# Patient Record
Sex: Female | Born: 1980
Health system: Southern US, Community
[De-identification: ages and names within clinical notes are randomized; demographics above are authoritative.]

## PROBLEM LIST (undated history)

## (undated) DIAGNOSIS — R569 Unspecified convulsions: Secondary | ICD-10-CM

## (undated) DIAGNOSIS — I10 Essential (primary) hypertension: Secondary | ICD-10-CM

## (undated) DIAGNOSIS — K219 Gastro-esophageal reflux disease without esophagitis: Secondary | ICD-10-CM

## (undated) DIAGNOSIS — F988 Other specified behavioral and emotional disorders with onset usually occurring in childhood and adolescence: Secondary | ICD-10-CM

## (undated) HISTORY — DX: Essential (primary) hypertension: I10

## (undated) HISTORY — DX: Unspecified convulsions: R56.9

## (undated) HISTORY — DX: Gastro-esophageal reflux disease without esophagitis: K21.9

---
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
## 1999-04-24 ENCOUNTER — Other Ambulatory Visit: Admission: RE | Admit: 1999-04-24 | Discharge: 1999-04-24 | Payer: Self-pay | Admitting: Obstetrics and Gynecology

## 1999-05-30 ENCOUNTER — Other Ambulatory Visit: Admission: RE | Admit: 1999-05-30 | Discharge: 1999-05-30 | Payer: Self-pay | Admitting: *Deleted

## 1999-05-30 ENCOUNTER — Encounter (INDEPENDENT_AMBULATORY_CARE_PROVIDER_SITE_OTHER): Payer: Self-pay | Admitting: Specialist

## 1999-11-20 ENCOUNTER — Other Ambulatory Visit: Admission: RE | Admit: 1999-11-20 | Discharge: 1999-11-20 | Payer: Self-pay | Admitting: *Deleted

## 1999-12-24 ENCOUNTER — Encounter (INDEPENDENT_AMBULATORY_CARE_PROVIDER_SITE_OTHER): Payer: Self-pay | Admitting: Specialist

## 1999-12-24 ENCOUNTER — Other Ambulatory Visit: Admission: RE | Admit: 1999-12-24 | Discharge: 1999-12-24 | Payer: Self-pay | Admitting: *Deleted

## 2000-05-08 ENCOUNTER — Other Ambulatory Visit: Admission: RE | Admit: 2000-05-08 | Discharge: 2000-05-08 | Payer: Self-pay | Admitting: *Deleted

## 2000-08-13 ENCOUNTER — Other Ambulatory Visit: Admission: RE | Admit: 2000-08-13 | Discharge: 2000-08-13 | Payer: Self-pay | Admitting: *Deleted

## 2000-08-13 ENCOUNTER — Encounter (INDEPENDENT_AMBULATORY_CARE_PROVIDER_SITE_OTHER): Payer: Self-pay | Admitting: Specialist

## 2001-02-12 ENCOUNTER — Other Ambulatory Visit: Admission: RE | Admit: 2001-02-12 | Discharge: 2001-02-12 | Payer: Self-pay | Admitting: *Deleted

## 2001-09-17 ENCOUNTER — Other Ambulatory Visit: Admission: RE | Admit: 2001-09-17 | Discharge: 2001-09-17 | Payer: Self-pay | Admitting: *Deleted

## 2003-04-28 ENCOUNTER — Other Ambulatory Visit: Admission: RE | Admit: 2003-04-28 | Discharge: 2003-04-28 | Payer: Self-pay | Admitting: *Deleted

## 2004-05-20 ENCOUNTER — Other Ambulatory Visit: Admission: RE | Admit: 2004-05-20 | Discharge: 2004-05-20 | Payer: Self-pay | Admitting: Obstetrics and Gynecology

## 2008-09-08 ENCOUNTER — Inpatient Hospital Stay (HOSPITAL_COMMUNITY): Admission: AD | Admit: 2008-09-08 | Discharge: 2008-09-11 | Payer: Self-pay | Admitting: Obstetrics and Gynecology

## 2010-06-09 LAB — LACTATE DEHYDROGENASE: LDH: 252 U/L — ABNORMAL HIGH (ref 94–250)

## 2010-06-09 LAB — CBC
HCT: 34.4 % — ABNORMAL LOW (ref 36.0–46.0)
HCT: 34.7 % — ABNORMAL LOW (ref 36.0–46.0)
HCT: 37.4 % (ref 36.0–46.0)
HCT: 37.9 % (ref 36.0–46.0)
Hemoglobin: 12 g/dL (ref 12.0–15.0)
Hemoglobin: 12.2 g/dL (ref 12.0–15.0)
Hemoglobin: 13.3 g/dL (ref 12.0–15.0)
MCHC: 35 g/dL (ref 30.0–36.0)
MCHC: 35.1 g/dL (ref 30.0–36.0)
MCHC: 35.2 g/dL (ref 30.0–36.0)
MCHC: 35.3 g/dL (ref 30.0–36.0)
MCV: 93.2 fL (ref 78.0–100.0)
MCV: 93.9 fL (ref 78.0–100.0)
MCV: 94.9 fL (ref 78.0–100.0)
Platelets: 114 10*3/uL — ABNORMAL LOW (ref 150–400)
Platelets: 127 10*3/uL — ABNORMAL LOW (ref 150–400)
Platelets: 131 10*3/uL — ABNORMAL LOW (ref 150–400)
RBC: 3.65 MIL/uL — ABNORMAL LOW (ref 3.87–5.11)
RBC: 3.66 MIL/uL — ABNORMAL LOW (ref 3.87–5.11)
RBC: 4.01 MIL/uL (ref 3.87–5.11)
RDW: 13.5 % (ref 11.5–15.5)
RDW: 13.6 % (ref 11.5–15.5)
RDW: 13.6 % (ref 11.5–15.5)
WBC: 13.9 10*3/uL — ABNORMAL HIGH (ref 4.0–10.5)
WBC: 14.1 10*3/uL — ABNORMAL HIGH (ref 4.0–10.5)

## 2010-06-09 LAB — CCBB MATERNAL DONOR DRAW

## 2010-06-09 LAB — COMPREHENSIVE METABOLIC PANEL
ALT: 16 U/L (ref 0–35)
AST: 29 U/L (ref 0–37)
Albumin: 2.4 g/dL — ABNORMAL LOW (ref 3.5–5.2)
Alkaline Phosphatase: 132 U/L — ABNORMAL HIGH (ref 39–117)
GFR calc Af Amer: >60 mL/min (ref >60)
Glucose, Bld: 84 mg/dL (ref 70–99)
Potassium: 3.7 mEq/L (ref 3.5–5.1)
Sodium: 138 mEq/L (ref 135–145)
Total Protein: 5.1 g/dL — ABNORMAL LOW (ref 6.0–8.3)

## 2010-07-16 NOTE — Op Note (Signed)
NAMEMARYLIN, LATHON             ACCOUNT NO.:  [id number]   MEDICAL RECORD NO.:  [id number]          PATIENT TYPE:  INP   LOCATION:  9120                          FACILITY:  [facility]   PHYSICIAN:  Duke Salvia. Marcelle Overlie, M.D.DATE OF BIRTH:  02-Jul-1980   DATE OF PROCEDURE:  09/09/2008  DATE OF DISCHARGE:                               OPERATIVE REPORT   DELIVERY NOTE:  The patient was complete and pushing, variable  decelerations were noted.  Decision was made to proceed with vacuum-  assisted delivery due to bradycardia.  At that point, she was +3 and  with the scalp visible, straight OA with good epidural anesthesia, and  her bladder had recently been drained.  Kiwi was applied, traction  efforts coordinated with maternal pushing, which let down the pressure  in between x3 traction, efforts to effect delivery of a female, Apgars 7  and 9, pH 7.24, loose nuchal cord x2 was noted.  The NICU team was  present after the nurse's request secondary to bradycardia.  Placenta  was delivered spontaneously intact, cord blood sent for donation.  A  second-degree perineal laceration and bilateral superficial periurethral  lacerations were repaired with 3-0 Vicryl Rapide.  Mother and baby doing  well at that point.      Richard M. Marcelle Overlie, M.D.  Electronically Signed     [facility]/MEDQ  D:  09/09/2008  T:  09/10/2008  Job:  [id number]

## 2010-07-24 ENCOUNTER — Inpatient Hospital Stay (HOSPITAL_COMMUNITY)
Admission: AD | Admit: 2010-07-24 | Discharge: 2010-07-27 | DRG: 774 | Disposition: A | Discharge status: Home / Self Care | Payer: 59 | Source: Ambulatory Visit | Attending: Obstetrics and Gynecology | Admitting: Obstetrics and Gynecology

## 2010-07-24 DIAGNOSIS — O1002 Pre-existing essential hypertension complicating childbirth: Principal | ICD-10-CM | POA: Diagnosis present

## 2010-07-24 LAB — URINALYSIS, ROUTINE W REFLEX MICROSCOPIC
Bilirubin Urine: NEGATIVE
Glucose, UA: NEGATIVE mg/dL
Ketones, ur: NEGATIVE mg/dL
Protein, ur: NEGATIVE mg/dL
pH: 7.5 (ref 5.0–8.0)

## 2010-07-24 LAB — CBC
HCT: 37.5 % (ref 36.0–46.0)
Hemoglobin: 12.4 g/dL (ref 12.0–15.0)
MCH: 29.3 pg (ref 26.0–34.0)
MCHC: 33.1 g/dL (ref 30.0–36.0)
MCV: 88.7 fL (ref 78.0–100.0)
RBC: 4.23 MIL/uL (ref 3.87–5.11)

## 2010-07-24 LAB — COMPREHENSIVE METABOLIC PANEL
AST: 17 U/L (ref 0–37)
Albumin: 2.3 g/dL — ABNORMAL LOW (ref 3.5–5.2)
Alkaline Phosphatase: 209 U/L — ABNORMAL HIGH (ref 39–117)
BUN: 8 mg/dL (ref 6–23)
Chloride: 104 mEq/L (ref 96–112)
Creatinine, Ser: 0.54 mg/dL (ref 0.4–1.2)
GFR calc Af Amer: >60 mL/min (ref >60)
Potassium: 3.7 mEq/L (ref 3.5–5.1)
Total Bilirubin: 0.4 mg/dL (ref 0.3–1.2)
Total Protein: 5.8 g/dL — ABNORMAL LOW (ref 6.0–8.3)

## 2010-07-24 LAB — URINE MICROSCOPIC-ADD ON

## 2010-07-24 LAB — URIC ACID: Uric Acid, Serum: 5.7 mg/dL (ref 2.4–7.0)

## 2010-07-25 ENCOUNTER — Other Ambulatory Visit: Payer: Self-pay | Admitting: Obstetrics and Gynecology

## 2010-07-25 LAB — RPR: RPR Ser Ql: NONREACTIVE

## 2010-07-26 LAB — CBC
HCT: 34.4 % — ABNORMAL LOW (ref 36.0–46.0)
Hemoglobin: 11.3 g/dL — ABNORMAL LOW (ref 12.0–15.0)
MCHC: 32.8 g/dL (ref 30.0–36.0)
RBC: 3.81 MIL/uL — ABNORMAL LOW (ref 3.87–5.11)

## 2013-02-04 ENCOUNTER — Ambulatory Visit (INDEPENDENT_AMBULATORY_CARE_PROVIDER_SITE_OTHER): Payer: 59 | Admitting: Physician Assistant

## 2013-02-04 VITALS — BP 100/80 | HR 100 | Temp 98.8°F | Resp 16 | Ht 65.0 in | Wt 143.0 lb

## 2013-02-04 DIAGNOSIS — R05 Cough: Secondary | ICD-10-CM

## 2013-02-04 DIAGNOSIS — J069 Acute upper respiratory infection, unspecified: Secondary | ICD-10-CM

## 2013-02-04 DIAGNOSIS — F988 Other specified behavioral and emotional disorders with onset usually occurring in childhood and adolescence: Secondary | ICD-10-CM | POA: Insufficient documentation

## 2013-02-04 MED ORDER — HYDROCOD POLST-CHLORPHEN POLST 10-8 MG/5ML PO LQCR: 5.0000 mL | Freq: Two times a day (BID) | ORAL | Status: AC

## 2013-02-04 NOTE — Progress Notes (Signed)
   Subjective:    Patient ID: Megan Lewis, female    DOB: [birth year], 32 y.o.   MRN: [id number]  HPI Pt presents to clinic with 3 day h/o cold symptoms.  1 week ago for a day she had myalgias but they completely resolved. Then 3 days ago she lost her voice and developed a cough with myalgias.  When it started the cough was dry in her throat - like a tickle and she still has that type of cough but now she has green sputum that is worse in the am an dis seems like it has dropped into her chest a little.  Still worse at night when she lays down.  She is a Armed forces operational officer.  No h/o asthma, non-smoking OTC med - mucinex for the last couple of days Sick contacts - none Review of Systems  Constitutional: Positive for chills. Negative for fever.  HENT: Positive for postnasal drip (intermittent), sore throat and voice change. Negative for congestion.   Respiratory: Positive for cough.   Musculoskeletal: Positive for myalgias.  Psychiatric/Behavioral: Positive for sleep disturbance (2nd toc ough).       Objective:   Physical Exam  Vitals reviewed. Constitutional: She is oriented to person, place, and time. She appears well-developed and well-nourished.  HENT:  Head: Normocephalic and atraumatic.  Right Ear: Hearing, tympanic membrane, external ear and ear canal normal.  Left Ear: Hearing, tympanic membrane, external ear and ear canal normal.  Nose: Mucosal edema (red) present.  Mouth/Throat: Uvula is midline, oropharynx is clear and moist and mucous membranes are normal.  Eyes: Conjunctivae are normal.  Neck: Neck supple.  Cardiovascular: Normal rate, regular rhythm and normal heart sounds.   No murmur heard. Pulmonary/Chest: Effort normal and breath sounds normal. She has no wheezes.  Lymphadenopathy:    She has no cervical adenopathy.  Neurological: She is alert and oriented to person, place, and time.  Skin: Skin is warm and dry.  Psychiatric: She has a normal mood and affect. Her  behavior is normal. Judgment and thought content normal.       Assessment & Plan:  Cough - Plan: chlorpheniramine-HYDROcodone (TUSSIONEX PENNKINETIC ER) 10-8 MG/5ML LQCR  Push fluids. Continue mucinex - Call in 3 days if the productive cough has not gotten improved for an abx to cover bronchitis.  Benny Lennert PA-C 02/04/2013 9:34 AM

## 2014-04-19 ENCOUNTER — Emergency Department (HOSPITAL_COMMUNITY)
Admission: EM | Admit: 2014-04-19 | Discharge: 2014-04-20 | Disposition: A | Discharge status: Home / Self Care | Payer: 59 | Attending: Emergency Medicine | Admitting: Emergency Medicine

## 2014-04-19 ENCOUNTER — Encounter (HOSPITAL_COMMUNITY): Payer: Self-pay | Admitting: *Deleted

## 2014-04-19 DIAGNOSIS — Z79899 Other long term (current) drug therapy: Secondary | ICD-10-CM | POA: Insufficient documentation

## 2014-04-19 DIAGNOSIS — R1010 Upper abdominal pain, unspecified: Secondary | ICD-10-CM | POA: Diagnosis not present

## 2014-04-19 DIAGNOSIS — R112 Nausea with vomiting, unspecified: Secondary | ICD-10-CM | POA: Diagnosis not present

## 2014-04-19 DIAGNOSIS — Z3202 Encounter for pregnancy test, result negative: Secondary | ICD-10-CM | POA: Diagnosis not present

## 2014-04-19 DIAGNOSIS — M549 Dorsalgia, unspecified: Secondary | ICD-10-CM | POA: Insufficient documentation

## 2014-04-19 DIAGNOSIS — R109 Unspecified abdominal pain: Secondary | ICD-10-CM | POA: Diagnosis present

## 2014-04-19 DIAGNOSIS — R140 Abdominal distension (gaseous): Secondary | ICD-10-CM | POA: Diagnosis not present

## 2014-04-19 LAB — URINE MICROSCOPIC-ADD ON

## 2014-04-19 LAB — CBC WITH DIFFERENTIAL/PLATELET
BASOS PCT: 0 % (ref 0–1)
Basophils Absolute: 0 10*3/uL (ref 0.0–0.1)
EOS PCT: 2 % (ref 0–5)
Eosinophils Absolute: 0.2 10*3/uL (ref 0.0–0.7)
HEMATOCRIT: 46.2 % — AB (ref 36.0–46.0)
Hemoglobin: 16.2 g/dL — ABNORMAL HIGH (ref 12.0–15.0)
LYMPHS ABS: 2.2 10*3/uL (ref 0.7–4.0)
LYMPHS PCT: 22 % (ref 12–46)
MCH: 32 pg (ref 26.0–34.0)
MCHC: 35.1 g/dL (ref 30.0–36.0)
MCV: 91.3 fL (ref 78.0–100.0)
MONO ABS: 0.6 10*3/uL (ref 0.1–1.0)
Monocytes Relative: 6 % (ref 3–12)
NEUTROS ABS: 7.2 10*3/uL (ref 1.7–7.7)
Neutrophils Relative %: 70 % (ref 43–77)
PLATELETS: 193 10*3/uL (ref 150–400)
RBC: 5.06 MIL/uL (ref 3.87–5.11)
RDW: 12.4 % (ref 11.5–15.5)
WBC: 10.3 10*3/uL (ref 4.0–10.5)

## 2014-04-19 LAB — COMPREHENSIVE METABOLIC PANEL
ALT: 13 U/L (ref 0–35)
ANION GAP: 6 (ref 5–15)
AST: 17 U/L (ref 0–37)
Albumin: 4.1 g/dL (ref 3.5–5.2)
Alkaline Phosphatase: 71 U/L (ref 39–117)
BILIRUBIN TOTAL: 1.1 mg/dL (ref 0.3–1.2)
BUN: 9 mg/dL (ref 6–23)
CO2: 28 mmol/L (ref 19–32)
CREATININE: 0.55 mg/dL (ref 0.50–1.10)
Calcium: 9 mg/dL (ref 8.4–10.5)
Chloride: 103 mmol/L (ref 96–112)
GFR calc Af Amer: >90 mL/min (ref >90)
Glucose, Bld: 96 mg/dL (ref 70–99)
POTASSIUM: 3.4 mmol/L — AB (ref 3.5–5.1)
SODIUM: 137 mmol/L (ref 135–145)
Total Protein: 7.3 g/dL (ref 6.0–8.3)

## 2014-04-19 LAB — URINALYSIS, ROUTINE W REFLEX MICROSCOPIC
Bilirubin Urine: NEGATIVE
Glucose, UA: NEGATIVE mg/dL
Hgb urine dipstick: NEGATIVE
Ketones, ur: 15 mg/dL — AB
NITRITE: NEGATIVE
PH: 5.5 (ref 5.0–8.0)
Protein, ur: NEGATIVE mg/dL
Specific Gravity, Urine: 1.028 (ref 1.005–1.030)
Urobilinogen, UA: 0.2 mg/dL (ref 0.0–1.0)

## 2014-04-19 LAB — LIPASE, BLOOD: Lipase: 28 U/L (ref 11–59)

## 2014-04-19 LAB — POC URINE PREG, ED: Preg Test, Ur: NEGATIVE

## 2014-04-19 MED ORDER — SODIUM CHLORIDE 0.9 % IV SOLN
Freq: Once | INTRAVENOUS | Status: AC
  Administered 2014-04-19: 23:00:00 via INTRAVENOUS

## 2014-04-19 MED ORDER — IOHEXOL 300 MG/ML  SOLN
25.0000 mL | Freq: Once | INTRAMUSCULAR | Status: AC | PRN
  Administered 2014-04-19: 25 mL via ORAL

## 2014-04-19 NOTE — ED Notes (Signed)
POC Preg- Negative ?

## 2014-04-19 NOTE — ED Notes (Signed)
The pt has had abd pain since dec 2015.  Worse for the past 2 days.  lmp  None bc

## 2014-04-19 NOTE — ED Provider Notes (Signed)
CSN: [id number]     Arrival date & time 04/19/14  1856 History   First MD Initiated Contact with Patient 04/19/14 2216     Chief Complaint  Patient presents with  . Abdominal Pain     (Consider location/radiation/quality/duration/timing/severity/associated sxs/prior Treatment) HPI Comments: Patient presents to the emergency department with a 3 month history of intermittent diffuse, generalized abdominal pain, cramping associated with nausea, vomiting.  The pain radiates to her back.  She states it happens every 2-3 days.  She denies any fevers associated with this.  She has a negative OB/GYN history a rare occasional cyst that has been observed.  She has an IUD in place.  She is monogamous in a marriage.  Denies any vaginal discharge, vaginal bleeding, abnormal periods.  She startedtissue.  No in her physician's office who told her that she may be low in vitamin D and this was a recommendation that she supplement.  She has an appointment in several weeks with a GI physician.  She has strong family history for gallbladder disease, but cannot relate her symptoms to food consumption, position, time of the day  Patient is a 34 y.o. female presenting with abdominal pain. The history is provided by the patient.  Abdominal Pain Pain location:  Generalized Pain quality: aching and cramping   Pain radiates to:  Back Pain severity:  Mild Onset quality:  Gradual Timing:  Intermittent Progression:  Waxing and waning Chronicity:  Recurrent Context: not alcohol use, not diet changes, not eating, not laxative use, not recent illness, not recent travel, not retching, not sick contacts and not trauma   Relieved by:  None tried Worsened by:  Nothing tried Ineffective treatments:  None tried Associated symptoms: nausea and vomiting   Associated symptoms: no anorexia, no chest pain, no chills, no constipation, no dysuria, no fatigue, no fever, no shortness of breath, no sore throat, no vaginal bleeding and no  vaginal discharge   Nausea:    Severity:  Mild   Onset quality:  Gradual   Timing:  Intermittent   Progression:  Unchanged Vomiting:    Severity:  Moderate   Timing:  Intermittent   History reviewed. No pertinent past medical history. History reviewed. No pertinent past surgical history. No family history on file. History  Substance Use Topics  . Smoking status: Never Smoker   . Smokeless tobacco: Not on file  . Alcohol Use: Yes   OB History    No data available     Review of Systems  Constitutional: Negative for fever, chills and fatigue.  HENT: Negative for sore throat.   Respiratory: Negative for shortness of breath.   Cardiovascular: Negative for chest pain.  Gastrointestinal: Positive for nausea, vomiting and abdominal pain. Negative for constipation and anorexia.  Genitourinary: Negative for dysuria, frequency, flank pain, vaginal bleeding and vaginal discharge.  Musculoskeletal: Positive for back pain.  Skin: Negative for rash.  All other systems reviewed and are negative.     Allergies  Sulfa antibiotics and Nsaids  Home Medications   Prior to Admission medications   Medication Sig Start Date End Date Taking? Authorizing Provider  acetaminophen (TYLENOL) 500 MG tablet Take 1,000 mg by mouth every 6 (six) hours as needed for moderate pain.   Yes Historical Provider, MD  amphetamine-dextroamphetamine (ADDERALL XR) 10 MG 24 hr capsule Take 30 mg by mouth every morning.    Yes Historical Provider, MD  ondansetron (ZOFRAN) 4 MG tablet Take 1 tablet (4 mg total) by mouth every 6 (  six) hours. 04/20/14   Garald Balding, NP  sucralfate (CARAFATE) 1 G tablet Take 1 tablet (1 g total) by mouth 4 (four) times daily -  with meals and at bedtime. 04/20/14   Garald Balding, NP   BP 110/76 mmHg  Pulse 74  Temp(Src) 98.7 F (37.1 C) (Oral)  Resp 19  SpO2 100% Physical Exam  Constitutional: She is oriented to person, place, and time. She appears well-developed and  well-nourished.  HENT:  Head: Normocephalic.  Eyes: Pupils are equal, round, and reactive to light.  Neck: Normal range of motion.  Cardiovascular: Normal rate and regular rhythm.   Pulmonary/Chest: Effort normal and breath sounds normal.  Abdominal: Soft. Bowel sounds are normal. She exhibits no distension. There is no tenderness.  Musculoskeletal: Normal range of motion.  Neurological: She is alert and oriented to person, place, and time.  Skin: Skin is warm. No rash noted.  Nursing note and vitals reviewed.   ED Course  Procedures (including critical care time) Labs Review Labs Reviewed  CBC WITH DIFFERENTIAL/PLATELET - Abnormal; Notable for the following:    Hemoglobin 16.2 (*)    HCT 46.2 (*)    All other components within normal limits  COMPREHENSIVE METABOLIC PANEL - Abnormal; Notable for the following:    Potassium 3.4 (*)    All other components within normal limits  URINALYSIS, ROUTINE W REFLEX MICROSCOPIC - Abnormal; Notable for the following:    APPearance CLOUDY (*)    Ketones, ur 15 (*)    Leukocytes, UA TRACE (*)    All other components within normal limits  URINE MICROSCOPIC-ADD ON - Abnormal; Notable for the following:    Bacteria, UA FEW (*)    All other components within normal limits  LIPASE, BLOOD  POC URINE PREG, ED    Imaging Review Ct Abdomen Pelvis W Contrast  04/20/2014   CLINICAL DATA:  Severe abdominal pain radiating into back. Nausea and diarrhea. Fourth episodes since December.  EXAM: CT ABDOMEN AND PELVIS WITH CONTRAST  TECHNIQUE: Multidetector CT imaging of the abdomen and pelvis was performed using the standard protocol following bolus administration of intravenous contrast.  CONTRAST:  144mL OMNIPAQUE IOHEXOL 300 MG/ML  SOLN  COMPARISON:  None.  FINDINGS: There are normal appearances of the liver, spleen, pancreas, adrenals and kidneys.  Mesentery and bowel are normal.  The appendix is normal.  An IUD is noted. There are otherwise normal  appearances of the uterus and ovaries.  No inflammatory changes are evident in the abdomen or pelvis. There is no ascites. There is no adenopathy.  There are no significant abnormalities in the lower chest. There are no significant musculoskeletal abnormalities.  IMPRESSION: No significant abnormality   Electronically Signed   By: Andreas Newport M.D.   On: 04/20/2014 01:20     EKG Interpretation None     Discussed CT Scan will start Carafate and encourage FU with GI as scheduled  MDM   Final diagnoses:  Distended abdomen  Pain of upper abdomen        Garald Balding, NP 04/20/14 0230  Varney Biles, MD 04/20/14 7672

## 2014-04-19 NOTE — ED Notes (Signed)
Notified CT pt has finished drinking contrast

## 2014-04-20 ENCOUNTER — Emergency Department (HOSPITAL_COMMUNITY): Payer: 59

## 2014-04-20 ENCOUNTER — Encounter (HOSPITAL_COMMUNITY): Payer: Self-pay | Admitting: Radiology

## 2014-04-20 MED ORDER — ONDANSETRON HCL 4 MG PO TABS: 4.0000 mg | ORAL_TABLET | Freq: Four times a day (QID) | ORAL | Status: DC

## 2014-04-20 MED ORDER — IOHEXOL 300 MG/ML  SOLN
100.0000 mL | Freq: Once | INTRAMUSCULAR | Status: AC | PRN
  Administered 2014-04-20: 100 mL via INTRAVENOUS

## 2014-04-20 MED ORDER — SUCRALFATE 1 G PO TABS
1.0000 g | ORAL_TABLET | Freq: Three times a day (TID) | ORAL | Status: DC
Start: 2014-04-20 — End: 2016-04-25

## 2014-04-20 NOTE — Discharge Instructions (Signed)
Abdominal Pain Many things can cause belly (abdominal) pain. Most times, the belly pain is not dangerous. Many cases of belly pain can be watched and treated at home. HOME CARE   Do not take medicines that help you go poop (laxatives) unless told to by your doctor.  Only take medicine as told by your doctor.  Eat or drink as told by your doctor. Your doctor will tell you if you should be on a special diet. GET HELP IF:  You do not know what is causing your belly pain.  You have belly pain while you are sick to your stomach (nauseous) or have runny poop (diarrhea).  You have pain while you pee or poop.  Your belly pain wakes you up at night.  You have belly pain that gets worse or better when you eat.  You have belly pain that gets worse when you eat fatty foods.  You have a fever. GET HELP RIGHT AWAY IF:   The pain does not go away within 2 hours.  You keep throwing up (vomiting).  The pain changes and is only in the right or left part of the belly.  You have bloody or tarry looking poop. MAKE SURE YOU:   Understand these instructions.  Will watch your condition.  Will get help right away if you are not doing well or get worse. Document Released: 08/06/2007 Document Revised: 02/22/2013 Document Reviewed: 10/27/2012 [facility] Patient Information 2015 [place], [place]. This information is not intended to replace advice given to you by your health care provider. Make sure you discuss any questions you have with your health care provider. Please take the Carafate as directed Follow up with the GI doctors as scheduled on Friday

## 2014-04-20 NOTE — ED Notes (Signed)
Pt transported to radiology.

## 2016-01-25 IMAGING — CT CT ABD-PELV W/ CM
2 of 4 series · 16 of 46 positions shown, 18 images · IV contrast (Omni 300)
Comparison: None.

CLINICAL DATA: Severe abdominal pain radiating into back. Nausea
and diarrhea. Fourth episodes since [REDACTED].

EXAM:
CT ABDOMEN AND PELVIS WITH CONTRAST
TECHNIQUE: Multidetector CT imaging of the abdomen and pelvis was performed
using the standard protocol following bolus administration of
intravenous contrast.
CONTRAST:  100mL OMNIPAQUE IOHEXOL 300 MG/ML  SOLN

[Series 2: abd/ pelvis 5.0 i30f 1 · axial · 0.70mm/px · z∈[-200,+190]mm · 13 of 86 slices shown, 15 images]
[im 4/86  soft-tissue]
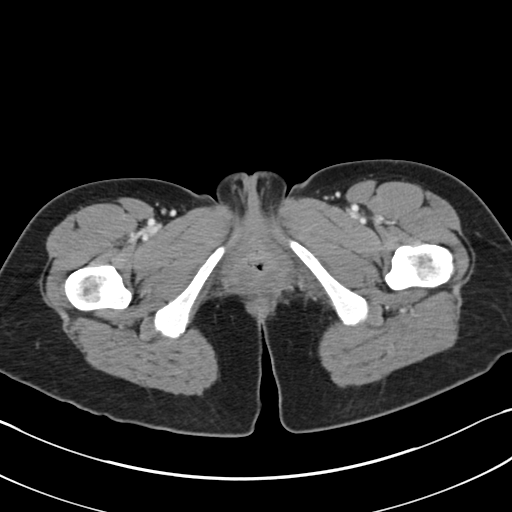
[im 4/86  bone]
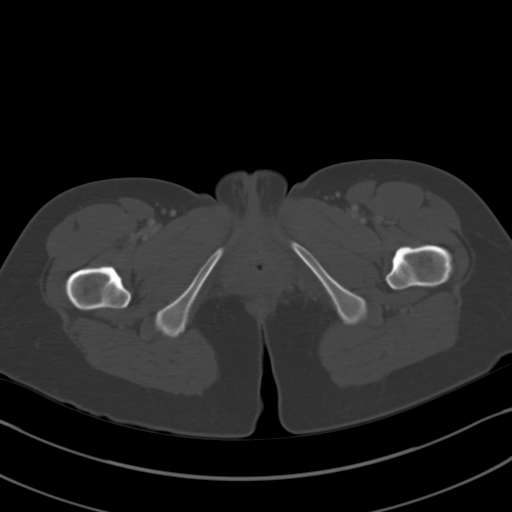
[im 11/86  soft-tissue]
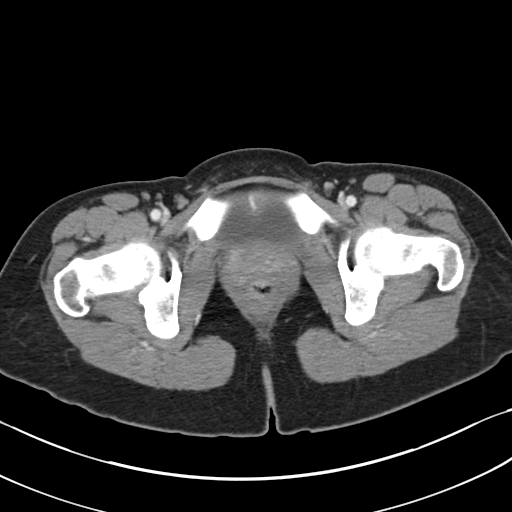
[im 18/86  soft-tissue]
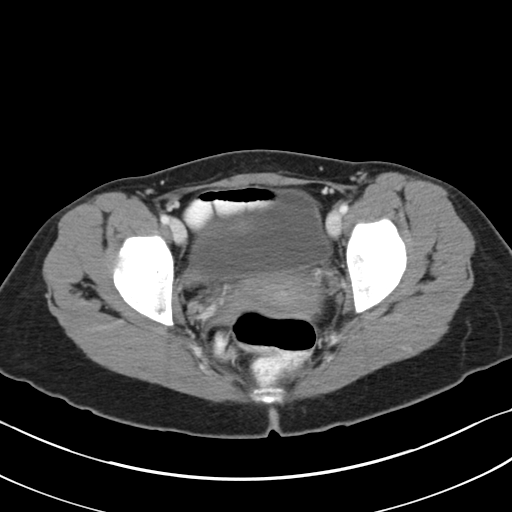
[im 24/86  soft-tissue]
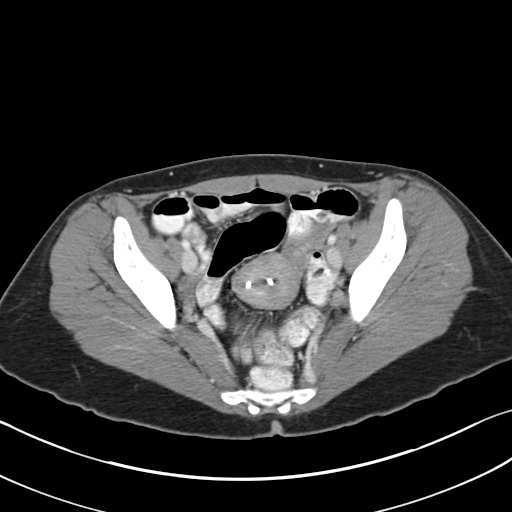
[im 31/86  soft-tissue]
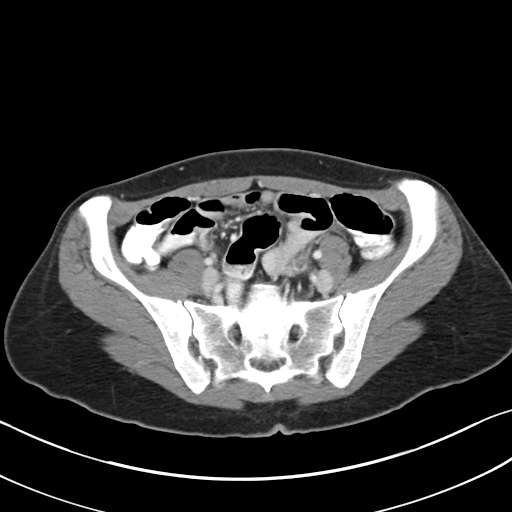
[im 38/86  soft-tissue]
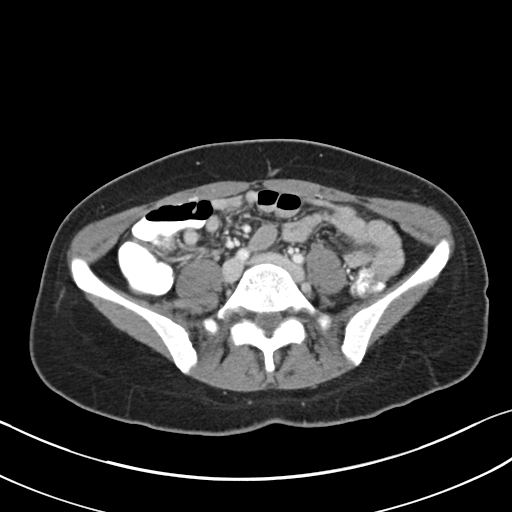
[im 45/86  soft-tissue]
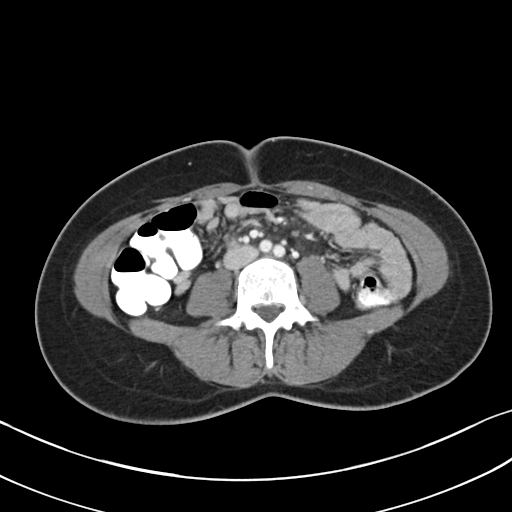
[im 48/86  soft-tissue]
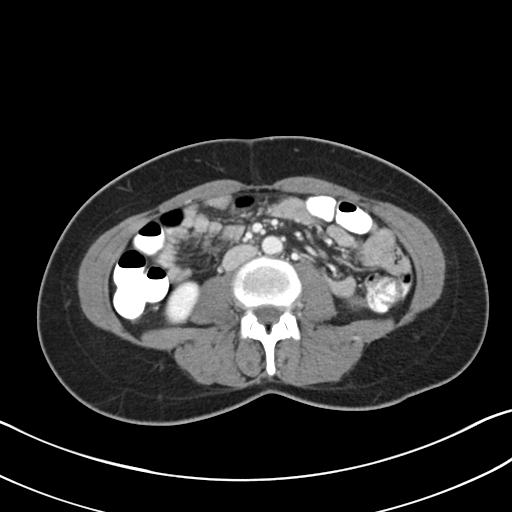
[im 55/86  soft-tissue]
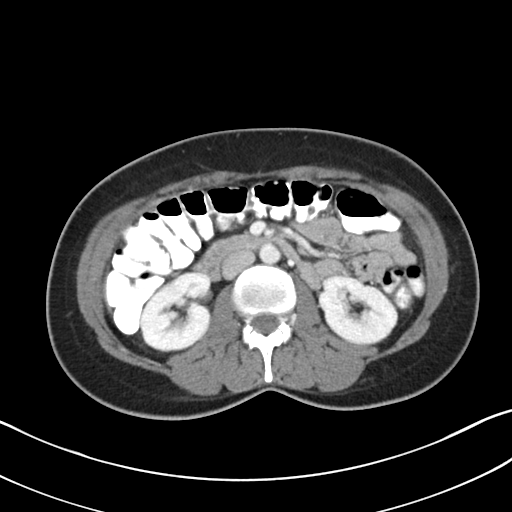
[im 55/86  bone]
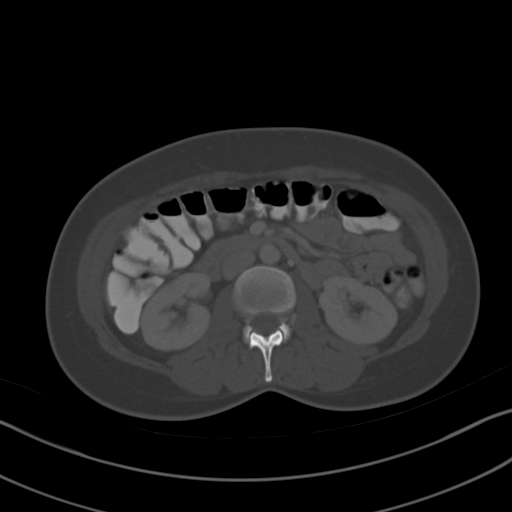
[im 62/86  soft-tissue]
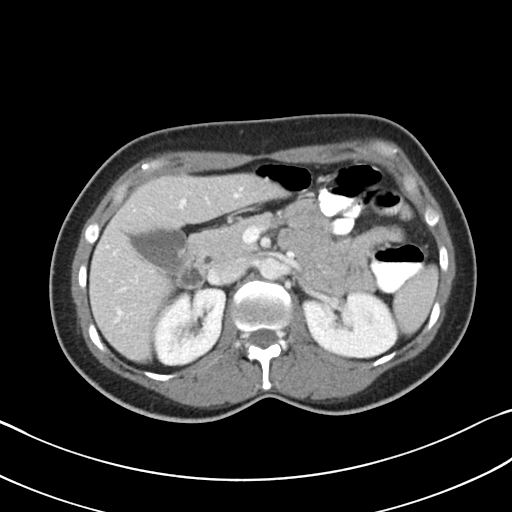
[im 69/86  soft-tissue]
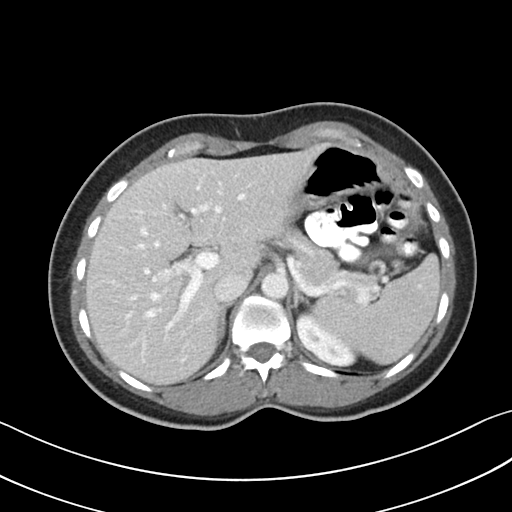
[im 75/86  soft-tissue]
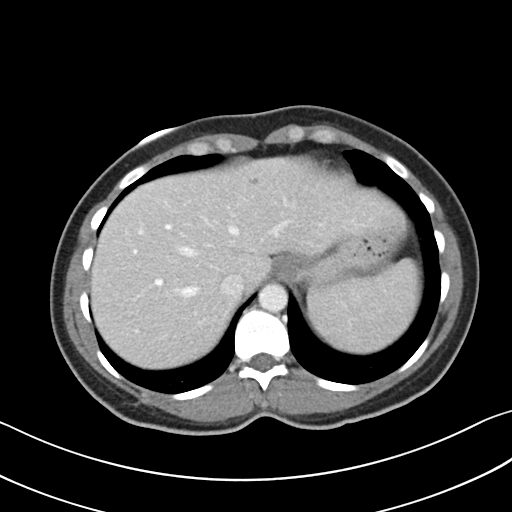
[im 82/86  soft-tissue]
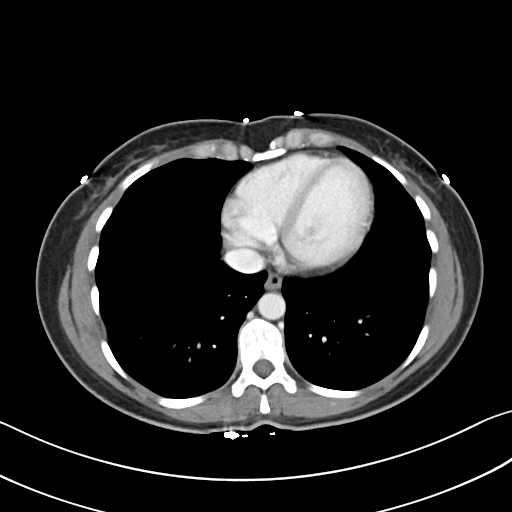

[Series 5: coronals · coronal · 0.70mm/px · 3 of 103 slices shown]
[im 35/103  soft-tissue]
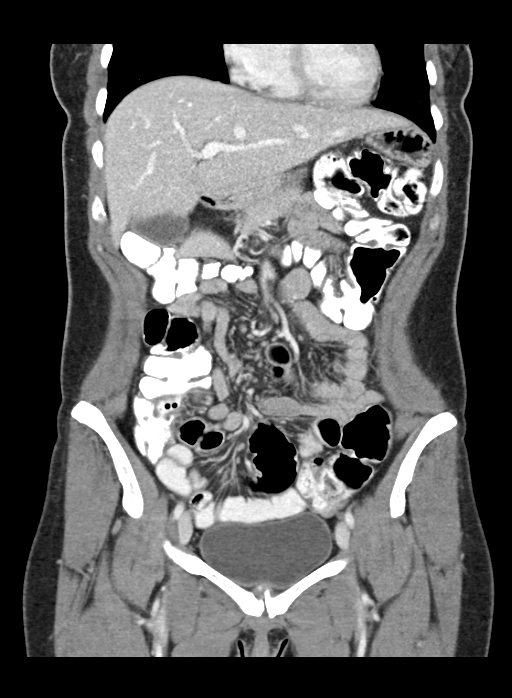
[im 46/103  soft-tissue]
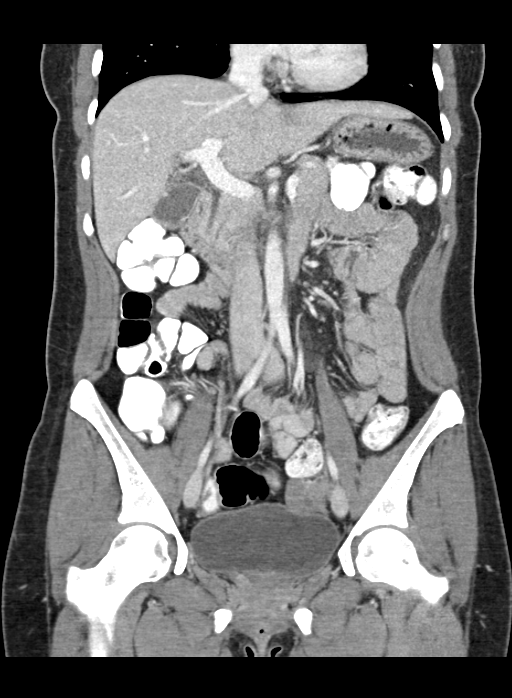
[im 57/103  soft-tissue]
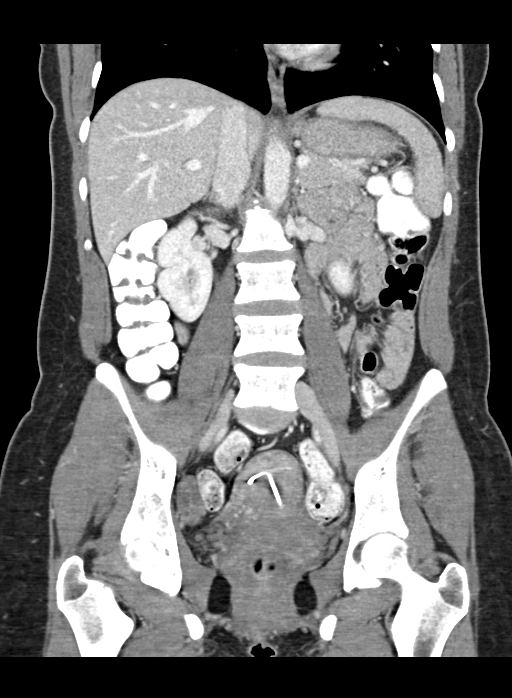

[16 of 46 positions shown; findings below may reference images not displayed]

FINDINGS: There are normal appearances of the liver, spleen, pancreas,
adrenals and kidneys.

Mesentery and bowel are normal.  The appendix is normal.

An IUD is noted. There are otherwise normal appearances of the
uterus and ovaries.

No inflammatory changes are evident in the abdomen or pelvis. There
is no ascites. There is no adenopathy.

There are no significant abnormalities in the lower chest. There are
no significant musculoskeletal abnormalities.
IMPRESSION: No significant abnormality

## 2016-04-25 ENCOUNTER — Ambulatory Visit (INDEPENDENT_AMBULATORY_CARE_PROVIDER_SITE_OTHER): Payer: 59 | Admitting: Family Medicine

## 2016-04-25 ENCOUNTER — Encounter: Payer: Self-pay | Admitting: Family Medicine

## 2016-04-25 VITALS — BP 110/70 | HR 91 | Resp 12 | Ht 65.0 in | Wt 145.1 lb

## 2016-04-25 DIAGNOSIS — K580 Irritable bowel syndrome with diarrhea: Secondary | ICD-10-CM | POA: Diagnosis not present

## 2016-04-25 DIAGNOSIS — F988 Other specified behavioral and emotional disorders with onset usually occurring in childhood and adolescence: Secondary | ICD-10-CM

## 2016-04-25 DIAGNOSIS — K219 Gastro-esophageal reflux disease without esophagitis: Secondary | ICD-10-CM | POA: Diagnosis not present

## 2016-04-25 DIAGNOSIS — K589 Irritable bowel syndrome without diarrhea: Secondary | ICD-10-CM | POA: Insufficient documentation

## 2016-04-25 MED ORDER — AMPHETAMINE-DEXTROAMPHET ER 30 MG PO CP24: 30.0000 mg | ORAL_CAPSULE | ORAL | 0 refills | Status: DC

## 2016-04-25 MED ORDER — PANTOPRAZOLE SODIUM 20 MG PO TBEC: 20.0000 mg | DELAYED_RELEASE_TABLET | Freq: Every day | ORAL | 1 refills | Status: DC

## 2016-04-25 NOTE — Patient Instructions (Addendum)
A few things to remember from today's visit:   Attention deficit disorder, unspecified hyperactivity presence - Plan: amphetamine-dextroamphetamine (ADDERALL XR) 30 MG 24 hr capsule, DISCONTINUED: amphetamine-dextroamphetamine (ADDERALL XR) 30 MG 24 hr capsule  Gastroesophageal reflux disease without esophagitis - Plan: pantoprazole (PROTONIX) 20 MG tablet  Medication contract next visit.    Avoid foods that make your symptoms worse, for example coffee, chocolate,pepermeint,alcohol, and greasy food. Raising the head of your bed about 6 inches may help with nocturnal symptoms.  Avoid tobacco use. Weight loss (if you are overweight). Avoid lying down for 3 hours after eating.  Instead 3 large meals daily try small and more frequent meals during the day.  Every medication have side effects and medications for GERD are not the exception.At this time I think benefit is greater than risk.  There has been some concerns about dementia and medications like Omeprazole or Nexium (PPI) but recent studies do not show a relation. Also kidney function can be affected among some patients that take these type of medications, we will follow accordingly. Taking these medications for long term could increase risk of osteoporosis (some debate now), vitamin deficiencies (Vit D and B12 specialty), increases risk of pneumonia.  You should be evaluated immediately if bloody vomiting, bloody stools, black stools (like tar), difficulty swallowing, food gets stuck on the way down or choking when eating. Abnormal weight loss or severe abdominal pain.  If symptoms are not resolved sometimes endoscopy is necessary.  Please be sure medication list is accurate. If a new problem present, please set up appointment sooner than planned today.

## 2016-04-25 NOTE — Progress Notes (Signed)
Pre visit review using our clinic review tool, if applicable. No additional management support is needed unless otherwise documented below in the visit note. 

## 2016-04-25 NOTE — Progress Notes (Signed)
HPI:   [name] is a 36 y.o. female, who is here today to establish care with me.  Former PCP: Ms Megan Lewis Last preventive routine visit: She follows with gyn, next appt 05/2016.  She does not exercise nor follows a healthy diet consistently.  Chronic medical problems: ADD,IBS-D, GERD,and Hx of recurrent labialis HSV.   Concerns today: Medication refill  Hx of ADD, Dx about 3 years ago by her former PCP.  According to pt, when she is not on medications she misplaces paper, disorganized, and does not complete tasks on time. She is currently on Adderall XR 30 mg daily and takes Adderall short acting 10 mg between 1-3 pm as needed.States that a Rx of the latter one lasts months.  She denies side effects from medication including insomnia,anxiety,headaches, or tics.   GERD: She takes PPI as needed. Ocassional heartburn, usually when she eats certain food.  Hx of IBS-D but has not had symptoms in a while.Symptoms are exacerbated by stress.  Denies abdominal pain, nausea, vomiting, diarrhea/constipation, blood in stool, or melena.  She takes Bentyl 10 mg tid as needed, which helps.   Review of Systems  Constitutional: Negative for activity change, appetite change, fatigue, fever and unexpected weight change.  HENT: Negative for mouth sores, nosebleeds and trouble swallowing.   Eyes: Negative for redness and visual disturbance.  Respiratory: Negative for cough, shortness of breath and wheezing.   Cardiovascular: Negative for chest pain, palpitations and leg swelling.  Gastrointestinal: Negative for abdominal pain, nausea and vomiting.       Negative for changes in bowel habits.  Genitourinary: Negative for decreased urine volume and hematuria.  Musculoskeletal: Negative for gait problem and myalgias.  Skin: Negative for rash.  Neurological: Negative for syncope, weakness and headaches.  Psychiatric/Behavioral: Positive for decreased concentration. Negative  for confusion and sleep disturbance. The patient is not nervous/anxious.       Current Outpatient Prescriptions on File Prior to Visit  Medication Sig Dispense Refill  . acetaminophen (TYLENOL) 500 MG tablet Take 1,000 mg by mouth every 6 (six) hours as needed for moderate pain.     No current facility-administered medications on file prior to visit.      Past Medical History:  Diagnosis Date  . GERD (gastroesophageal reflux disease)   . Hypertension   . Seizures (HCC)    Allergies  Allergen Reactions  . Sulfa Antibiotics Anaphylaxis  . Nsaids Other (See Comments)    Severe burning from stomach to back    Family History  Problem Relation Age of Onset  . Hyperlipidemia Mother   . Heart disease Mother   . Hypertension Mother   . Hyperlipidemia Father   . Heart disease Father   . Hypertension Father     Social History   Social History  . Marital status: Married    Spouse name: N/A  . Number of children: N/A  . Years of education: N/A   Social History Main Topics  . Smoking status: Never Smoker  . Smokeless tobacco: Never Used  . Alcohol use Yes  . Drug use: No  . Sexual activity: Yes   Other Topics Concern  . None   Social History Narrative  . None    Vitals:   04/25/16 0745  BP: 110/70  Pulse: 91  Resp: 12  O2 sat at RA 99%.  Body mass index is 24.15 kg/m.  Physical Exam  Nursing note and vitals reviewed. Constitutional: She is oriented to  person, place, and time. She appears well-developed and well-nourished. No distress.  HENT:  Head: Atraumatic.  Mouth/Throat: Oropharynx is clear and moist and mucous membranes are normal.  Eyes: Conjunctivae and EOM are normal. Pupils are equal, round, and reactive to light.  Neck: No tracheal deviation present. No thyroid mass and no thyromegaly present.  Cardiovascular: Normal rate and regular rhythm.   No murmur heard. Pulses:      Dorsalis pedis pulses are 2+ on the right side, and 2+ on the left  side.  Respiratory: Effort normal and breath sounds normal. No respiratory distress.  GI: Soft. She exhibits no mass. There is no hepatomegaly. There is no tenderness.  Musculoskeletal: She exhibits no edema or tenderness.  Lymphadenopathy:    She has no cervical adenopathy.  Neurological: She is alert and oriented to person, place, and time. She has normal strength. Coordination and gait normal.  Skin: Skin is warm. No erythema.  Psychiatric: She has a normal mood and affect.  Well groomed, good eye contact.      ASSESSMENT AND PLAN:    Megan Lewis was seen today for establish care.  Diagnoses and all orders for this visit:  Attention deficit disorder, unspecified hyperactivity presence  Last Rx filled 03/07/16. It seems well controlled under current regimen, so no changes. Rx's x 2 given today. Med contract next OV. We discussed some side effects of medications, she understands it is a controlled med and missing Rx's will not be replaced. F/U in 3-4 months.   -     Discontinue: amphetamine-dextroamphetamine (ADDERALL XR) 30 MG 24 hr capsule; Take 1 capsule (30 mg total) by mouth every morning. -     amphetamine-dextroamphetamine (ADDERALL XR) 30 MG 24 hr capsule; Take 1 capsule (30 mg total) by mouth every morning.  Gastroesophageal reflux disease without esophagitis  Symptomatic. GERD precautions discussed. PPI side effects reviewed, agrees with taking it daily but lower dose, decreased from 40 mg to 20 mg. F/U in 3-4 months.   -     pantoprazole (PROTONIX) 20 MG tablet; Take 1 tablet (20 mg total) by mouth daily.  Irritable bowel syndrome with diarrhea  Asymptomatic at this time. Bentyl as needed can be continued, she does not need Rx. F/U as needed.     Megan Lewis [name], MD  [facility]. [place] office.

## 2016-04-28 ENCOUNTER — Encounter: Payer: Self-pay | Admitting: Family Medicine

## 2016-07-01 ENCOUNTER — Telehealth: Payer: Self-pay | Admitting: Family Medicine

## 2016-07-01 DIAGNOSIS — F988 Other specified behavioral and emotional disorders with onset usually occurring in childhood and adolescence: Secondary | ICD-10-CM

## 2016-07-01 NOTE — Telephone Encounter (Signed)
Pt needs new generic adderall xr 30. Pt has one pill left

## 2016-07-01 NOTE — Telephone Encounter (Signed)
Patient does have 3-4 month follow up scheduled for 5/24.

## 2016-07-02 MED ORDER — AMPHETAMINE-DEXTROAMPHET ER 30 MG PO CP24: 30.0000 mg | ORAL_CAPSULE | ORAL | 0 refills | Status: DC

## 2016-07-02 NOTE — Telephone Encounter (Signed)
Rx for Adderall XR 30 mg can be printed to take one caps daily, # 30/0.  [[place] controlled subs web site last refilled 05/31/16] Thanks, BJ

## 2016-07-02 NOTE — Telephone Encounter (Signed)
Rx printed for MD Signature

## 2016-07-02 NOTE — Telephone Encounter (Signed)
Called and let patient know that Rx is up front & ready for pick up. Patient verbalized understanding.

## 2016-07-23 NOTE — Progress Notes (Signed)
HPI:   [name] is a 36 y.o. female, who is here today to follow on ADD.  She was seen on 04/25/16.  Currently she is on Adderall XR 30 mg daily and not longer on Adderall immediate release as needed. She is taking medication Monday through Friday usually and as needed during weekends.  She takes it with breakfast, occasionally around 3 pm she feels "craching down" but in geenral she feels like current dose is working fine.  Eating healthy and exercising has helped.  She denies side effects.  Symptoms otherwise well controlled.   Review of Systems  Constitutional: Negative for appetite change, fever and unexpected weight change.  Respiratory: Negative for shortness of breath and wheezing.   Cardiovascular: Negative for chest pain, palpitations and leg swelling.  Gastrointestinal: Negative for abdominal pain, nausea and vomiting.       No changes in bowel habits.  Skin: Negative for rash.  Neurological: Negative for dizziness, tremors, seizures and headaches.  Psychiatric/Behavioral: Negative for hallucinations and suicidal ideas. The patient is not nervous/anxious.       Current Outpatient Prescriptions on File Prior to Visit  Medication Sig Dispense Refill  . acetaminophen (TYLENOL) 500 MG tablet Take 1,000 mg by mouth every 6 (six) hours as needed for moderate pain.    Marland Kitchen amphetamine-dextroamphetamine (ADDERALL XR) 30 MG 24 hr capsule Take 1 capsule (30 mg total) by mouth every morning. 30 capsule 0  . pantoprazole (PROTONIX) 20 MG tablet Take 1 tablet (20 mg total) by mouth daily. 90 tablet 1   No current facility-administered medications on file prior to visit.      Past Medical History:  Diagnosis Date  . GERD (gastroesophageal reflux disease)   . Hypertension   . Seizures (HCC)    Allergies  Allergen Reactions  . Sulfa Antibiotics Anaphylaxis  . Nsaids Other (See Comments)    Severe burning from stomach to back    Social History    Social History  . Marital status: Married    Spouse name: N/A  . Number of children: N/A  . Years of education: N/A   Social History Main Topics  . Smoking status: Never Smoker  . Smokeless tobacco: Never Used  . Alcohol use Yes  . Drug use: No  . Sexual activity: Yes   Other Topics Concern  . None   Social History Narrative  . None    Vitals:   07/24/16 0933  BP: 120/70  Pulse: 80  Resp: 12   Body mass index is 23.98 kg/m.    Physical Exam  Nursing note and vitals reviewed. Constitutional: She is oriented to person, place, and time. She appears well-developed. No distress.  HENT:  Mouth/Throat: Oropharynx is clear and moist and mucous membranes are normal.  Eyes: Conjunctivae and EOM are normal. Pupils are equal, round, and reactive to light.  Cardiovascular: Normal rate and regular rhythm.   No murmur heard. Respiratory: Effort normal and breath sounds normal. No respiratory distress.  Musculoskeletal: She exhibits no edema.  Neurological: She is alert and oriented to person, place, and time. She has normal strength. Gait normal.  Skin: Skin is warm. No erythema.  Psychiatric: She has a normal mood and affect. Her speech is normal.  Well groomed, good eye contact.      ASSESSMENT AND PLAN:     Megan Lewis was seen today for follow-up.  Diagnoses and all orders for this visit:  Attention deficit disorder, unspecified hyperactivity presence  Med contract signed. Stable. No changes in current management. Some side effects of medication discussed. Rx's x 2 given today. F/U in 4-5 months.    -[name] was advised to return sooner than planned today if new concerns arise.       Massie Cogliano [name], MD  [facility]. [place] office.

## 2016-07-24 ENCOUNTER — Encounter: Payer: Self-pay | Admitting: Family Medicine

## 2016-07-24 ENCOUNTER — Ambulatory Visit (INDEPENDENT_AMBULATORY_CARE_PROVIDER_SITE_OTHER): Payer: 59 | Admitting: Family Medicine

## 2016-07-24 VITALS — BP 120/70 | HR 80 | Resp 12 | Ht 65.0 in | Wt 144.1 lb

## 2016-07-24 DIAGNOSIS — F988 Other specified behavioral and emotional disorders with onset usually occurring in childhood and adolescence: Secondary | ICD-10-CM

## 2016-07-24 MED ORDER — AMPHETAMINE-DEXTROAMPHET ER 30 MG PO CP24: 30.0000 mg | ORAL_CAPSULE | ORAL | 0 refills | Status: DC

## 2016-08-28 ENCOUNTER — Encounter (HOSPITAL_BASED_OUTPATIENT_CLINIC_OR_DEPARTMENT_OTHER): Payer: Self-pay | Admitting: *Deleted

## 2016-08-28 ENCOUNTER — Emergency Department (HOSPITAL_BASED_OUTPATIENT_CLINIC_OR_DEPARTMENT_OTHER)
Admission: EM | Admit: 2016-08-28 | Discharge: 2016-08-29 | Disposition: A | Discharge status: Home / Self Care | Payer: 59 | Attending: Emergency Medicine | Admitting: Emergency Medicine

## 2016-08-28 DIAGNOSIS — Z79899 Other long term (current) drug therapy: Secondary | ICD-10-CM | POA: Insufficient documentation

## 2016-08-28 DIAGNOSIS — R1084 Generalized abdominal pain: Secondary | ICD-10-CM | POA: Diagnosis not present

## 2016-08-28 DIAGNOSIS — I10 Essential (primary) hypertension: Secondary | ICD-10-CM | POA: Diagnosis not present

## 2016-08-28 LAB — URINALYSIS, ROUTINE W REFLEX MICROSCOPIC
BILIRUBIN URINE: NEGATIVE
Glucose, UA: NEGATIVE mg/dL
Hgb urine dipstick: NEGATIVE
KETONES UR: 15 mg/dL — AB
Leukocytes, UA: NEGATIVE
NITRITE: NEGATIVE
PH: 5.5 (ref 5.0–8.0)
Protein, ur: NEGATIVE mg/dL
SPECIFIC GRAVITY, URINE: 1.024 (ref 1.005–1.030)

## 2016-08-28 LAB — PREGNANCY, URINE: Preg Test, Ur: NEGATIVE

## 2016-08-28 MED ORDER — ONDANSETRON 4 MG PO TBDP
4.0000 mg | ORAL_TABLET | Freq: Once | ORAL | Status: AC
  Administered 2016-08-28: 4 mg via ORAL
  Filled 2016-08-28: qty 1

## 2016-08-28 NOTE — ED Triage Notes (Addendum)
Mid abdominal pain tonight. Vomiting. She has the same problem once a month about the same time of month.

## 2016-08-28 NOTE — ED Provider Notes (Signed)
Megan [facility] DEPT [facility] Provider Note: Megan Spurling, MD, FACEP  CSN: [id number] MRN: [id number] ARRIVAL: 08/28/16 at 2141 ROOM: [place] [facility]  Abdominal Pain   [name]  Megan Lewis is a 36 y.o. female with a long-standing history, greater than a year, of episodic abdominal pain. It typically occurs once a month. Her current episode began this afternoon about 4 PM. She describes the pain as a generalized burning sensation. There is some waxing and waning but it is otherwise constant. She does not characterize it is cramping. She rates her pain between a 5 and an 8 out of 10. There is associated nausea and vomiting. She's been worked up for this by gastroenterologist and was told it was likely irritable bowel syndrome. She has not had diarrhea with this episode. She took Protonix earlier without relief. She was given Zofran on arrival with partial relief of her nausea.  Consultation with the [facility] state controlled substances database reveals the patient has received no opioid prescriptions in the past year.   Past Medical History:  Diagnosis Date  . GERD (gastroesophageal reflux disease)   . Hypertension   . Seizures ([employer])     History reviewed. No pertinent surgical history.  Family History  Problem Relation Age of Onset  . Hyperlipidemia Mother   . Heart disease Mother   . Hypertension Mother   . Hyperlipidemia Father   . Heart disease Father   . Hypertension Father   . Cancer Maternal Grandmother        breast  . Cancer Paternal Grandfather        lung    Social History  Substance Use Topics  . Smoking status: Never Smoker  . Smokeless tobacco: Never Used  . Alcohol use Yes    Prior to Admission medications   Medication Sig Start Date End Date Taking? Authorizing Provider  acetaminophen (TYLENOL) 500 MG tablet Take 1,000 mg by mouth every 6 (six) hours as needed for moderate pain.   Yes [provider]    amphetamine-dextroamphetamine (ADDERALL XR) 30 MG 24 hr capsule Take 1 capsule (30 mg total) by mouth every morning. 07/24/16  Yes Martinique, Betty G, MD  pantoprazole (PROTONIX) 20 MG tablet Take 1 tablet (20 mg total) by mouth daily. 04/25/16  Yes Martinique, Betty G, MD    Allergies Sulfa antibiotics and Nsaids   REVIEW OF SYSTEMS  Negative except as noted here or in the History of Present Illness.   PHYSICAL EXAMINATION  Initial Vital Signs Blood pressure 117/80, pulse 61, temperature 98.3 F (36.8 C), temperature source Oral, resp. rate 18, height 5\' 4"  (1.626 m), weight 65.8 kg (145 lb), SpO2 100 %.  Examination General: Well-developed, well-nourished female in no acute distress; appearance consistent with age of record HENT: normocephalic; atraumatic Eyes: pupils equal, round and reactive to light; extraocular muscles intact Neck: supple Heart: regular rate and rhythm Lungs: clear to auscultation bilaterally Abdomen: soft; nondistended; mild diffuse tenderness; no masses or hepatosplenomegaly; bowel sounds present Extremities: No deformity; full range of motion; pulses normal Neurologic: Awake, alert and oriented; motor function intact in all extremities and symmetric; no facial droop Skin: Warm and dry Psychiatric: Normal mood and affect   RESULTS  Summary of this visit's results, reviewed by myself:   EKG Interpretation  Date/Time:    Ventricular Rate:    PR Interval:    QRS Duration:   QT Interval:    QTC Calculation:   R Axis:  Text Interpretation:        Laboratory Studies: Results for orders placed or performed during the hospital encounter of 08/28/16 (from the past 24 hour(s))  Urinalysis, Routine w reflex microscopic     Status: Abnormal   Collection Time: 08/28/16  9:50 PM  Result Value Ref Range   Color, Urine YELLOW YELLOW   APPearance CLEAR CLEAR   Specific Gravity, Urine 1.024 1.005 - 1.030   pH 5.5 5.0 - 8.0   Glucose, UA NEGATIVE NEGATIVE  mg/dL   Hgb urine dipstick NEGATIVE NEGATIVE   Bilirubin Urine NEGATIVE NEGATIVE   Ketones, ur 15 (A) NEGATIVE mg/dL   Protein, ur NEGATIVE NEGATIVE mg/dL   Nitrite NEGATIVE NEGATIVE   Leukocytes, UA NEGATIVE NEGATIVE  Pregnancy, urine     Status: None   Collection Time: 08/28/16  9:50 PM  Result Value Ref Range   Preg Test, Ur NEGATIVE NEGATIVE  CBC with Differential/Platelet     Status: Abnormal   Collection Time: 08/29/16 12:55 AM  Result Value Ref Range   WBC 8.3 4.0 - 10.5 K/uL   RBC 4.42 3.87 - 5.11 MIL/uL   Hemoglobin 14.2 12.0 - 15.0 g/dL   HCT 39.0 36.0 - 46.0 %   MCV 88.2 78.0 - 100.0 fL   MCH 32.1 26.0 - 34.0 pg   MCHC 36.4 (H) 30.0 - 36.0 g/dL   RDW 12.2 11.5 - 15.5 %   Platelets 172 150 - 400 K/uL   Neutrophils Relative % 78 %   Neutro Abs 6.4 1.7 - 7.7 K/uL   Lymphocytes Relative 16 %   Lymphs Abs 1.3 0.7 - 4.0 K/uL   Monocytes Relative 6 %   Monocytes Absolute 0.5 0.1 - 1.0 K/uL   Eosinophils Relative 0 %   Eosinophils Absolute 0.0 0.0 - 0.7 K/uL   Basophils Relative 0 %   Basophils Absolute 0.0 0.0 - 0.1 K/uL  Comprehensive metabolic panel     Status: Abnormal   Collection Time: 08/29/16 12:55 AM  Result Value Ref Range   Sodium 136 135 - 145 mmol/L   Potassium 3.9 3.5 - 5.1 mmol/L   Chloride 104 101 - 111 mmol/L   CO2 24 22 - 32 mmol/L   Glucose, Bld 129 (H) 65 - 99 mg/dL   BUN 10 6 - 20 mg/dL   Creatinine, Ser 0.50 0.44 - 1.00 mg/dL   Calcium 9.4 8.9 - 10.3 mg/dL   Total Protein 7.2 6.5 - 8.1 g/dL   Albumin 4.4 3.5 - 5.0 g/dL   AST 17 15 - 41 U/L   ALT 10 (L) 14 - 54 U/L   Alkaline Phosphatase 60 38 - 126 U/L   Total Bilirubin 1.5 (H) 0.3 - 1.2 mg/dL   GFR calc non Af Amer >60 >60 mL/min   GFR calc Af Amer >60 >60 mL/min   Anion gap 8 5 - 15   Imaging Studies: No results found.  ED COURSE  Nursing notes and initial vitals signs, including pulse oximetry, reviewed.  Vitals:   08/28/16 2152 08/28/16 2154 08/29/16 0012  BP:  117/80 (!)  144/90  Pulse:  61 (!) 52  Resp:  18 16  Temp:  98.3 F (36.8 C)   TempSrc:  Oral   SpO2:  100% 99%  Weight: 65.8 kg (145 lb)    Height: 5\' 4"  (1.626 m)     12:50 AM Nausea relieved with second dose of Zofran. Partial, transient relief of pain with IM Bentyl but pain  has now returned.  1:29 AM Significant relief with IV fentanyl.   PROCEDURES    ED DIAGNOSES     ICD-10-CM   1. Generalized abdominal pain R10.84        Shanon Rosser, MD 08/29/16 [id number]

## 2016-08-29 LAB — CBC WITH DIFFERENTIAL/PLATELET
BASOS ABS: 0 10*3/uL (ref 0.0–0.1)
Basophils Relative: 0 %
EOS PCT: 0 %
Eosinophils Absolute: 0 10*3/uL (ref 0.0–0.7)
HCT: 39 % (ref 36.0–46.0)
Hemoglobin: 14.2 g/dL (ref 12.0–15.0)
Lymphocytes Relative: 16 %
Lymphs Abs: 1.3 10*3/uL (ref 0.7–4.0)
MCH: 32.1 pg (ref 26.0–34.0)
MCHC: 36.4 g/dL — ABNORMAL HIGH (ref 30.0–36.0)
MCV: 88.2 fL (ref 78.0–100.0)
MONO ABS: 0.5 10*3/uL (ref 0.1–1.0)
Monocytes Relative: 6 %
Neutro Abs: 6.4 10*3/uL (ref 1.7–7.7)
Neutrophils Relative %: 78 %
Platelets: 172 10*3/uL (ref 150–400)
RBC: 4.42 MIL/uL (ref 3.87–5.11)
RDW: 12.2 % (ref 11.5–15.5)
WBC: 8.3 10*3/uL (ref 4.0–10.5)

## 2016-08-29 LAB — COMPREHENSIVE METABOLIC PANEL
ALT: 10 U/L — AB (ref 14–54)
ANION GAP: 8 (ref 5–15)
AST: 17 U/L (ref 15–41)
Albumin: 4.4 g/dL (ref 3.5–5.0)
Alkaline Phosphatase: 60 U/L (ref 38–126)
BILIRUBIN TOTAL: 1.5 mg/dL — AB (ref 0.3–1.2)
BUN: 10 mg/dL (ref 6–20)
CO2: 24 mmol/L (ref 22–32)
CREATININE: 0.5 mg/dL (ref 0.44–1.00)
Calcium: 9.4 mg/dL (ref 8.9–10.3)
Chloride: 104 mmol/L (ref 101–111)
GFR calc Af Amer: >60 mL/min (ref >60)
GFR calc non Af Amer: >60 mL/min (ref >60)
GLUCOSE: 129 mg/dL — AB (ref 65–99)
Potassium: 3.9 mmol/L (ref 3.5–5.1)
Sodium: 136 mmol/L (ref 135–145)
TOTAL PROTEIN: 7.2 g/dL (ref 6.5–8.1)

## 2016-08-29 MED ORDER — FENTANYL CITRATE (PF) 100 MCG/2ML IJ SOLN
100.0000 ug | Freq: Once | INTRAMUSCULAR | Status: AC
  Administered 2016-08-29: 100 ug via INTRAVENOUS
  Filled 2016-08-29: qty 2

## 2016-08-29 MED ORDER — ONDANSETRON 8 MG PO TBDP: 8.0000 mg | ORAL_TABLET | Freq: Three times a day (TID) | ORAL | 0 refills | Status: DC | PRN

## 2016-08-29 MED ORDER — HYDROCODONE-ACETAMINOPHEN 5-325 MG PO TABS: 1.0000 | ORAL_TABLET | ORAL | 0 refills | Status: DC | PRN

## 2016-08-29 MED ORDER — DICYCLOMINE HCL 10 MG/ML IM SOLN
20.0000 mg | Freq: Once | INTRAMUSCULAR | Status: AC
  Administered 2016-08-29: 20 mg via INTRAMUSCULAR
  Filled 2016-08-29: qty 2

## 2016-08-29 MED ORDER — ONDANSETRON 4 MG PO TBDP
4.0000 mg | ORAL_TABLET | Freq: Once | ORAL | Status: AC
  Administered 2016-08-29: 4 mg via ORAL
  Filled 2016-08-29: qty 1

## 2016-11-04 ENCOUNTER — Telehealth: Payer: Self-pay | Admitting: Family Medicine

## 2016-11-04 DIAGNOSIS — F988 Other specified behavioral and emotional disorders with onset usually occurring in childhood and adolescence: Secondary | ICD-10-CM

## 2016-11-04 NOTE — Telephone Encounter (Signed)
Pt request refill  amphetamine-dextroamphetamine (ADDERALL XR) 30 MG 24 hr capsule

## 2016-11-04 NOTE — Telephone Encounter (Signed)
Rx last filled 10/06/16.  Okay to refill?

## 2016-11-05 NOTE — Telephone Encounter (Signed)
Adderall XR 30 mg can be filled, # 30/0.  Thanks, BJ

## 2016-11-07 MED ORDER — AMPHETAMINE-DEXTROAMPHET ER 30 MG PO CP24: 30.0000 mg | ORAL_CAPSULE | ORAL | 0 refills | Status: DC

## 2016-11-07 NOTE — Telephone Encounter (Signed)
Left voicemail for patient letting her know that Rx is up front and ready for pick up.

## 2016-11-07 NOTE — Telephone Encounter (Signed)
Rx printed for signature. 

## 2016-12-03 ENCOUNTER — Telehealth: Payer: Self-pay | Admitting: Family Medicine

## 2016-12-03 DIAGNOSIS — F988 Other specified behavioral and emotional disorders with onset usually occurring in childhood and adolescence: Secondary | ICD-10-CM

## 2016-12-03 NOTE — Telephone Encounter (Signed)
Pt need new Rx for Adderall  Pt is aware of 3 business days for refills and someone will call when ready for pick up.

## 2016-12-05 ENCOUNTER — Other Ambulatory Visit: Payer: Self-pay | Admitting: Family Medicine

## 2016-12-07 NOTE — Telephone Encounter (Signed)
Rx for Adderall XR 30 mg can be printed # 30/0. [Medication last refilled 11/07/16]  Next follow up appt 12/2016.  Thanks, BJ

## 2016-12-08 MED ORDER — AMPHETAMINE-DEXTROAMPHET ER 30 MG PO CP24: 30.0000 mg | ORAL_CAPSULE | ORAL | 0 refills | Status: DC

## 2016-12-08 NOTE — Telephone Encounter (Signed)
I left a voicemail for patient letting her know the Rx is up front & ready for pick up.

## 2016-12-08 NOTE — Telephone Encounter (Signed)
Rx printed for signature. 

## 2016-12-18 DIAGNOSIS — Z6824 Body mass index (BMI) 24.0-24.9, adult: Secondary | ICD-10-CM | POA: Diagnosis not present

## 2016-12-18 DIAGNOSIS — Z01419 Encounter for gynecological examination (general) (routine) without abnormal findings: Secondary | ICD-10-CM | POA: Diagnosis not present

## 2016-12-26 ENCOUNTER — Ambulatory Visit: Payer: 59 | Admitting: Family Medicine

## 2017-01-01 NOTE — Progress Notes (Signed)
[name] is a 36 y.o.female, who is here today to follow on ADD.  Currently she is on Adderall XR 30 mg daily.  She is tolerating medication well, no side effects reported except when she forgets to eat she gets anxious and has tremors. She takes medication from Monday through Friday and prn during weekends.  Denies depression symptoms. No difficulty sleeping most of the time.  She denies any illicit drug use and keeps medication in a safe place.  She would like to try a different medication because she can feel when medication stops working in the afternoon, she does not like the feeling. She has not tried other medications since Dx. She was taking Adderall short acting in the afternoon as needed but this was discontinued 04/2016   Review of Systems  Constitutional: Negative for activity change, appetite change, fatigue and unexpected weight change.  HENT: Negative for facial swelling, mouth sores and trouble swallowing.   Eyes: Negative for redness and visual disturbance.  Respiratory: Negative for shortness of breath and wheezing.   Cardiovascular: Negative for chest pain, palpitations and leg swelling.  Gastrointestinal: Negative for abdominal pain, nausea and vomiting.       No changes in bowel habits.  Musculoskeletal: Negative for gait problem and myalgias.  Neurological: Positive for tremors. Negative for dizziness, syncope, weakness and headaches.  Psychiatric/Behavioral: Negative for behavioral problems, confusion, hallucinations and sleep disturbance. The patient is nervous/anxious.        Current Outpatient Medications on File Prior to Visit  Medication Sig Dispense Refill  . acetaminophen (TYLENOL) 500 MG tablet Take 1,000 mg by mouth every 6 (six) hours as needed for moderate pain.    . pantoprazole (PROTONIX) 20 MG tablet Take 1 tablet (20 mg total) by mouth daily. 90 tablet 1   No current facility-administered medications on file prior to visit.       Past Medical History:  Diagnosis Date  . GERD (gastroesophageal reflux disease)   . Hypertension   . Seizures (HCC)     Allergies  Allergen Reactions  . Sulfa Antibiotics Anaphylaxis  . Nsaids Other (See Comments)    Severe burning from stomach to back    Social History   Socioeconomic History  . Marital status: Married    Spouse name: None  . Number of children: None  . Years of education: None  . Highest education level: None  Social Needs  . Financial resource strain: None  . Food insecurity - worry: None  . Food insecurity - inability: None  . Transportation needs - medical: None  . Transportation needs - non-medical: None  Occupational History  . None  Tobacco Use  . Smoking status: Never Smoker  . Smokeless tobacco: Never Used  Substance and Sexual Activity  . Alcohol use: Yes  . Drug use: No  . Sexual activity: Yes  Other Topics Concern  . None  Social History Narrative  . None    Vitals:   01/02/17 0937  BP: 118/82  Pulse: 81  Resp: 12  Temp: 98.5 F (36.9 C)  SpO2: 98%   Body mass index is 25.23 kg/m.  Wt Readings from Last 3 Encounters:  01/02/17 147 lb (66.7 kg)  08/28/16 145 lb (65.8 kg)  07/24/16 144 lb 2 oz (65.4 kg)     Physical Exam  Nursing note and vitals reviewed. Constitutional: She is oriented to person, place, and time. She appears well-developed and well-nourished. No distress.  HENT:  Head:  Normocephalic.  Mouth/Throat: Oropharynx is clear and moist and mucous membranes are normal.  Eyes: Conjunctivae are normal. Pupils are equal, round, and reactive to light.  Cardiovascular: Normal rate and regular rhythm.   No murmur heard. Respiratory: Effort normal and breath sounds normal. No respiratory distress.  Musculoskeletal: She exhibits no edema.  Lymphadenopathy:    She has no cervical adenopathy.  Neurological: She is alert and oriented to person, place, and time. She has normal strength. Coordination and gait  normal.  Skin: Skin is warm. No rash noted. No erythema.  Psychiatric: Her mood appears anxious. Cognition and memory are normal.  Well groomed, good eye contact.    Megan Lewis was seen today for meication follow-up.  Diagnoses and all orders for this visit:  Attention deficit disorder, unspecified hyperactivity presence   Symptoms well controlled overall but it is not lasting all day and anxiety trigger by Adderall effect fading up at the end of the day. Stop Adderall.  Medication contract 07/24/16. Urine tox today. She agrees with trying Vyvanse 20 mg, will let me know in 2 weeks if dose needs to be increased. Side effects of medication reviewed. F/U in 6 weeks.  -     lisdexamfetamine (VYVANSE) 20 MG capsule; Take 1 capsule (20 mg total) by mouth daily.   High risk medication use -     ToxASSURE Select 13 (MW), Urine -     Pain Mgmt, Profile 8 w/Conf, U      Hosanna Betley [name], MD  [facility]. [place] office.

## 2017-01-02 ENCOUNTER — Ambulatory Visit (INDEPENDENT_AMBULATORY_CARE_PROVIDER_SITE_OTHER): Payer: 59 | Admitting: Family Medicine

## 2017-01-02 ENCOUNTER — Encounter: Payer: Self-pay | Admitting: Family Medicine

## 2017-01-02 VITALS — BP 118/82 | HR 81 | Temp 98.5°F | Resp 12 | Ht 64.0 in | Wt 147.0 lb

## 2017-01-02 DIAGNOSIS — F988 Other specified behavioral and emotional disorders with onset usually occurring in childhood and adolescence: Secondary | ICD-10-CM

## 2017-01-02 DIAGNOSIS — Z79899 Other long term (current) drug therapy: Secondary | ICD-10-CM | POA: Diagnosis not present

## 2017-01-02 MED ORDER — LISDEXAMFETAMINE DIMESYLATE 20 MG PO CAPS: 20.0000 mg | ORAL_CAPSULE | Freq: Every day | ORAL | 0 refills | Status: DC

## 2017-01-02 NOTE — Patient Instructions (Signed)
A few things to remember from today's visit:   Attention deficit disorder, unspecified hyperactivity presence - Plan: lisdexamfetamine (VYVANSE) 20 MG capsule, ToxASSURE Select 13 (MW), Urine  High risk medication use - Plan: ToxASSURE Select 13 (MW), Urine  In about 2 weeks please let me know through My Chart or by calling the office about tolerance of new medication.   Please be sure medication list is accurate. If a new problem present, please set up appointment sooner than planned today.

## 2017-01-05 LAB — PAIN MGMT, PROFILE 8 W/CONF, U
6 ACETYLMORPHINE: NEGATIVE ng/mL (ref <10)
Alcohol Metabolites: NEGATIVE ng/mL (ref <500)
Amphetamine: 8243 ng/mL — ABNORMAL HIGH (ref <250)
Amphetamines: POSITIVE ng/mL — AB (ref <500)
Benzodiazepines: NEGATIVE ng/mL (ref <100)
Buprenorphine, Urine: NEGATIVE ng/mL (ref <5)
Cocaine Metabolite: NEGATIVE ng/mL (ref <150)
Creatinine: 190.1 mg/dL
MARIJUANA METABOLITE: NEGATIVE ng/mL (ref <20)
MDMA: NEGATIVE ng/mL (ref <500)
Methamphetamine: NEGATIVE ng/mL (ref <250)
OPIATES: NEGATIVE ng/mL (ref <100)
Oxidant: NEGATIVE ug/mL (ref <200)
Oxycodone: NEGATIVE ng/mL (ref <100)
pH: 6.98 (ref 4.5–9.0)

## 2017-01-19 ENCOUNTER — Telehealth: Payer: Self-pay | Admitting: Family Medicine

## 2017-01-19 ENCOUNTER — Other Ambulatory Visit: Payer: Self-pay | Admitting: Family Medicine

## 2017-01-19 DIAGNOSIS — F988 Other specified behavioral and emotional disorders with onset usually occurring in childhood and adolescence: Secondary | ICD-10-CM

## 2017-01-19 MED ORDER — LISDEXAMFETAMINE DIMESYLATE 20 MG PO CAPS: 20.0000 mg | ORAL_CAPSULE | Freq: Every day | ORAL | 0 refills | Status: DC

## 2017-01-19 NOTE — Telephone Encounter (Signed)
Refill request

## 2017-01-19 NOTE — Telephone Encounter (Signed)
Left message informing that prescription is ready for pick up.

## 2017-01-19 NOTE — Telephone Encounter (Signed)
Rx for Vyvanse 20 mg printed and ready to pick up.  Thanks, BJ

## 2017-01-19 NOTE — Telephone Encounter (Signed)
Copied from [place] [id number] [id number]. Topic: Quick Communication - See Telephone Encounter >> Jan 19, 2017 12:26 PM Corie Chiquito, [place] wrote: CRM for notification. See Telephone encounter for: Patient needs a refill on her Vyvanse. Patient stated that this medication is working for her but it just runs out on her at the end of the day and if the dose could be increased  01/19/17.

## 2017-02-12 NOTE — Progress Notes (Signed)
Megan Lewis is a 36 y.o.female, who is here today to follow on ADD.  She was last seen on 01/02/17, when Adderall XR 30 mg was discontinued and Vyvanse 20 mg started. Dx with ADD around 2015, she was on Adderall XR 30 mg and until a few months ago she was also taking Adderall 10 mg in the afternoon.  Adderall was helping but she was having irritability in the afternoon when she felt Adderall weaning off.  Today she also tells me that Adderall was aggravating anxiety.   She is tolerating Vyvanse well, no side effects reported When she first started medication and stopped Adderall, she felt very fatigue,sleepy but these symptoms improved after 2-3 weeks of taking medication. Sleeping better. If she misses a dose she does not feel withdrawal symptoms, which she did when she was on Adderall.  She takes medication almost daily. Hx of anxiety,denies symptoms. Denies depression or suicidal thoughts.   Review of Systems  Constitutional: Negative for activity change, appetite change, fatigue and unexpected weight change.  HENT: Negative for mouth sores, nosebleeds and trouble swallowing.   Respiratory: Negative for shortness of breath and wheezing.   Cardiovascular: Negative for chest pain, palpitations and leg swelling.  Gastrointestinal: Negative for abdominal pain, nausea and vomiting.  Skin: Negative for rash.  Neurological: Negative for dizziness, tremors, weakness, numbness and headaches.  Psychiatric/Behavioral: Negative for behavioral problems, confusion, hallucinations and sleep disturbance. The patient is not nervous/anxious.        Current Outpatient Medications on File Prior to Visit  Medication Sig Dispense Refill  . acetaminophen (TYLENOL) 500 MG tablet Take 1,000 mg by mouth every 6 (six) hours as needed for moderate pain.    . pantoprazole (PROTONIX) 20 MG tablet Take 1 tablet (20 mg total) by mouth daily. 90 tablet 1   No current facility-administered  medications on file prior to visit.      Past Medical History:  Diagnosis Date  . GERD (gastroesophageal reflux disease)   . Hypertension   . Seizures (HCC)     Allergies  Allergen Reactions  . Sulfa Antibiotics Anaphylaxis  . Nsaids Other (See Comments)    Severe burning from stomach to back    Social History   Socioeconomic History  . Marital status: Married    Spouse name: None  . Number of children: None  . Years of education: None  . Highest education level: None  Social Needs  . Financial resource strain: None  . Food insecurity - worry: None  . Food insecurity - inability: None  . Transportation needs - medical: None  . Transportation needs - non-medical: None  Occupational History  . None  Tobacco Use  . Smoking status: Never Smoker  . Smokeless tobacco: Never Used  Substance and Sexual Activity  . Alcohol use: Yes  . Drug use: No  . Sexual activity: Yes  Other Topics Concern  . None  Social History Narrative  . None    Vitals:   02/13/17 0809  BP: 122/76  Pulse: 88  Resp: 14  Temp: 98.5 F (36.9 C)  SpO2: 98%   Body mass index is 26.31 kg/m.    Wt Readings from Last 3 Encounters:  02/13/17 153 lb 4 oz (69.5 kg)  01/02/17 147 lb (66.7 kg)  08/28/16 145 lb (65.8 kg)     Physical Exam  Nursing note and vitals reviewed. Constitutional: She is oriented to person, place, and time. She appears well-developed and well-nourished. No  distress.  HENT:  Head: Normocephalic.  Mouth/Throat: Oropharynx is clear and moist and mucous membranes are normal.  Eyes: Conjunctivae are normal. Pupils are equal, round, and reactive to light.  Cardiovascular: Normal rate and regular rhythm.  No murmur heard. Respiratory: Effort normal and breath sounds normal. No respiratory distress.  GI: Soft. She exhibits no mass. There is no tenderness.  Musculoskeletal: She exhibits no edema.  Lymphadenopathy:    She has no cervical adenopathy.  Neurological: She  is alert and oriented to person, place, and time. She has normal strength. Gait normal.  Skin: Skin is warm. No erythema.  Psychiatric: She has a normal mood and affect. Her speech is normal. Cognition and memory are normal.  Well groomed, good eye contact.    [name] was seen today for follow-up.  Diagnoses and all orders for this visit:  Attention deficit disorder, unspecified hyperactivity presence  Symptoms well controlled with Vyvanse, no changes in current management. Medication contract 07/2016 Side effects of medication reviewed. F/U in 4 months.  -     lisdexamfetamine (VYVANSE) 20 MG capsule; Take 1 capsule (20 mg total) by mouth daily.  Anxiety disorder, unspecified type  Improved after discontinuing Adderall. We will continue following. She does not feel like pharmacologic treatment is needed at this time.    Megan [name], MD  [facility]. [place] office.

## 2017-02-13 ENCOUNTER — Ambulatory Visit: Payer: 59 | Admitting: Family Medicine

## 2017-02-13 ENCOUNTER — Encounter: Payer: Self-pay | Admitting: Family Medicine

## 2017-02-13 VITALS — BP 122/76 | HR 88 | Temp 98.5°F | Resp 14 | Ht 64.0 in | Wt 153.2 lb

## 2017-02-13 DIAGNOSIS — F419 Anxiety disorder, unspecified: Secondary | ICD-10-CM | POA: Diagnosis not present

## 2017-02-13 DIAGNOSIS — F988 Other specified behavioral and emotional disorders with onset usually occurring in childhood and adolescence: Secondary | ICD-10-CM | POA: Diagnosis not present

## 2017-02-13 MED ORDER — LISDEXAMFETAMINE DIMESYLATE 20 MG PO CAPS: 20.0000 mg | ORAL_CAPSULE | Freq: Every day | ORAL | 0 refills | Status: DC

## 2017-02-13 NOTE — Patient Instructions (Signed)
A few things to remember from today's visit:   Attention deficit disorder, unspecified hyperactivity presence - Plan: lisdexamfetamine (VYVANSE) 20 MG capsule, DISCONTINUED: lisdexamfetamine (VYVANSE) 20 MG capsule, DISCONTINUED: lisdexamfetamine (VYVANSE) 20 MG capsule   Please be sure medication list is accurate. If a new problem present, please set up appointment sooner than planned today.

## 2017-04-08 DIAGNOSIS — Z20828 Contact with and (suspected) exposure to other viral communicable diseases: Secondary | ICD-10-CM | POA: Diagnosis not present

## 2017-04-08 DIAGNOSIS — J101 Influenza due to other identified influenza virus with other respiratory manifestations: Secondary | ICD-10-CM | POA: Diagnosis not present

## 2017-06-02 ENCOUNTER — Other Ambulatory Visit: Payer: Self-pay | Admitting: Family Medicine

## 2017-06-02 DIAGNOSIS — F988 Other specified behavioral and emotional disorders with onset usually occurring in childhood and adolescence: Secondary | ICD-10-CM

## 2017-06-05 ENCOUNTER — Other Ambulatory Visit: Payer: Self-pay | Admitting: Family Medicine

## 2017-06-05 DIAGNOSIS — F988 Other specified behavioral and emotional disorders with onset usually occurring in childhood and adolescence: Secondary | ICD-10-CM

## 2017-06-05 MED ORDER — LISDEXAMFETAMINE DIMESYLATE 20 MG PO CAPS: 20.0000 mg | ORAL_CAPSULE | Freq: Every day | ORAL | 0 refills | Status: DC

## 2017-06-05 NOTE — Telephone Encounter (Signed)
LOV  02/13/17 Dr. Martinique

## 2017-06-05 NOTE — Telephone Encounter (Signed)
Copied from [place] [id number]. Topic: Quick Communication - Rx Refill/Question >> Jun 05, 2017  8:10 AM Yvette Rack wrote: Medication: lisdexamfetamine (VYVANSE) 20 MG capsule Has the patient contacted their pharmacy? Yes.   (Agent: If no, request that the patient contact the pharmacy for the refill.) Preferred Pharmacy (with phone number or street name):  Agent: Please be advised that RX refills may take up to 3 business days. We ask that you follow-up with your pharmacy.

## 2017-06-05 NOTE — Telephone Encounter (Signed)
Pt will be there this afternoon to pick up.

## 2017-06-08 MED ORDER — LISDEXAMFETAMINE DIMESYLATE 20 MG PO CAPS
20.0000 mg | ORAL_CAPSULE | Freq: Every day | ORAL | 0 refills | Status: DC
Start: 2017-06-08 — End: 2017-07-15

## 2017-07-06 ENCOUNTER — Telehealth: Payer: Self-pay | Admitting: Family Medicine

## 2017-07-06 NOTE — Telephone Encounter (Signed)
Message sent to Dr. Jordan for approval. 

## 2017-07-06 NOTE — Telephone Encounter (Signed)
Copied from [place] [id number]. Topic: Quick Communication - Rx Refill/Question >> Jul 06, 2017  3:21 PM Ether Griffins B wrote: Medication: lisdexamfetamine (VYVANSE) 20 MG capsule  Has the patient contacted their pharmacy? Yes.   (Agent: If no, request that the patient contact the pharmacy for the refill.) Preferred Pharmacy (with phone number or street name): [facility] [place], [place] - [address] Agent: Please be advised that RX refills may take up to 3 business days. We ask that you follow-up with your pharmacy.

## 2017-07-07 NOTE — Telephone Encounter (Signed)
Tried calling patient, vm not set up, will try again at a later date.

## 2017-07-07 NOTE — Telephone Encounter (Signed)
She was last seen in 01/2017. Because this is a controlled medication 4 months follow-up was recommended, appointment was not arranged. She needs follow-up appointment.  Thanks, BJ

## 2017-07-08 NOTE — Telephone Encounter (Signed)
Tried calling patient to schedule follow-up, unable to leave message, voicemail not set up.

## 2017-07-10 NOTE — Telephone Encounter (Signed)
Patient has appointment scheduled for 07/17/17 for medication follow-up.

## 2017-07-14 NOTE — Progress Notes (Signed)
HPI:   [name] is a 37 y.o. female, who is here today for follow up on ADD.  She was last seen on 02/13/2017.  Currently she is on Vyvanse 20 mg daily. Today she is reporting that Vyvanse is not helping, she would like to go back to Adderall but wonders if she can take the immediate release instead extended release. Vyvanse is also causing frequent yawning and sleepiness during the day. She does not feel like she is focusing as well as she did with the Adderall.   On 01/02/17 she requested to change to a different medication, Adderall was not lasting log and aggravating anxiety. . She ha was having some mild anxiety with Adderall.   She states that she thinks anxiety was related to increased stress. She denies anxiety symptoms, states that she has not had any stress lately.    She was on Adderall XR 30 mg daily.    Review of Systems  Constitutional: Negative for activity change, appetite change, fatigue and unexpected weight change.  HENT: Negative for facial swelling, mouth sores and trouble swallowing.   Respiratory: Negative for shortness of breath and wheezing.   Cardiovascular: Negative for chest pain, palpitations and leg swelling.  Gastrointestinal: Negative for abdominal pain, nausea and vomiting.  Neurological: Negative for tremors, weakness and headaches.  Psychiatric/Behavioral: Negative for confusion and sleep disturbance. The patient is not nervous/anxious.     Dorothy Spark of 2 to 9 systems]   Current Outpatient Medications on File Prior to Visit  Medication Sig Dispense Refill  . acetaminophen (TYLENOL) 500 MG tablet Take 1,000 mg by mouth every 6 (six) hours as needed for moderate pain.    . pantoprazole (PROTONIX) 20 MG tablet Take 1 tablet (20 mg total) by mouth daily. 90 tablet 1  . valACYclovir (VALTREX) 500 MG tablet 4 TABLETS BY MOUTH AT FIRST SIGHT OF OUTBREAK AND 4 TABS 6 HOURS LATER  2   No current facility-administered medications on  file prior to visit.      Past Medical History:  Diagnosis Date  . GERD (gastroesophageal reflux disease)   . Hypertension   . Seizures (HCC)    Allergies  Allergen Reactions  . Sulfa Antibiotics Anaphylaxis  . Nsaids Other (See Comments)    Severe burning from stomach to back    Social History   Socioeconomic History  . Marital status: Married    Spouse name: Not on file  . Number of children: Not on file  . Years of education: Not on file  . Highest education level: Not on file  Occupational History  . Not on file  Social Needs  . Financial resource strain: Not on file  . Food insecurity:    Worry: Not on file    Inability: Not on file  . Transportation needs:    Medical: Not on file    Non-medical: Not on file  Tobacco Use  . Smoking status: Never Smoker  . Smokeless tobacco: Never Used  Substance and Sexual Activity  . Alcohol use: Yes  . Drug use: No  . Sexual activity: Yes  Lifestyle  . Physical activity:    Days per week: Not on file    Minutes per session: Not on file  . Stress: Not on file  Relationships  . Social connections:    Talks on phone: Not on file    Gets together: Not on file    Attends religious service: Not on file  Active member of club or organization: Not on file    Attends meetings of clubs or organizations: Not on file    Relationship status: Not on file  Other Topics Concern  . Not on file  Social History Narrative  . Not on file    Vitals:   07/15/17 0733  BP: 118/70  Pulse: 91  Resp: 12  Temp: 98.3 F (36.8 C)  SpO2: 99%   Body mass index is 27.01 kg/m.   Physical Exam  Nursing note and vitals reviewed. Constitutional: She is oriented to person, place, and time. She appears well-developed. No distress.  HENT:  Head: Normocephalic and atraumatic.  Mouth/Throat: Oropharynx is clear and moist and mucous membranes are normal.  Eyes: Pupils are equal, round, and reactive to light. Conjunctivae are normal.    Cardiovascular: Normal rate and regular rhythm.  No murmur heard. Respiratory: Effort normal and breath sounds normal. No respiratory distress.  Musculoskeletal: She exhibits no edema.  Neurological: She is alert and oriented to person, place, and time. She has normal strength. Gait normal.  Skin: Skin is warm. No erythema.  Psychiatric: She has a normal mood and affect.  Well groomed, good eye contact.       ASSESSMENT AND PLAN:   Megan Lewis was seen today for 4 months follow-up.  No orders of the defined types were placed in this encounter.   ADD (attention deficit disorder) Vyvanse discontinued. She will start Adderall XL 30 mg daily, explained that extended release is preferable than immediate release for ADD/ADHD treatment. We could also consider establishing with [facility] Attention Specialist. Some side effects discussed. Follow-up in 6 to 8 weeks, before if needed.           -Ms. SHAMEKA AGGARWAL was advised to return sooner than planned today if new concerns arise.       Gustave Lindeman [name], MD  [facility]. [place] office.

## 2017-07-15 ENCOUNTER — Ambulatory Visit: Payer: 59 | Admitting: Family Medicine

## 2017-07-15 ENCOUNTER — Encounter: Payer: Self-pay | Admitting: Family Medicine

## 2017-07-15 VITALS — BP 118/70 | HR 91 | Temp 98.3°F | Resp 12 | Ht 64.0 in | Wt 157.4 lb

## 2017-07-15 DIAGNOSIS — F419 Anxiety disorder, unspecified: Secondary | ICD-10-CM

## 2017-07-15 DIAGNOSIS — F988 Other specified behavioral and emotional disorders with onset usually occurring in childhood and adolescence: Secondary | ICD-10-CM | POA: Diagnosis not present

## 2017-07-15 MED ORDER — AMPHETAMINE-DEXTROAMPHET ER 30 MG PO CP24: 30.0000 mg | ORAL_CAPSULE | Freq: Every day | ORAL | 0 refills | Status: DC

## 2017-07-15 NOTE — Assessment & Plan Note (Addendum)
Vyvanse discontinued. She will start Adderall XL 30 mg daily, explained that extended release is preferable than immediate release for ADD/ADHD treatment. We could also consider establishing with [facility] Attention Specialist. Some side effects discussed. Follow-up in 6 to 8 weeks, before if needed.

## 2017-07-15 NOTE — Patient Instructions (Signed)
A few things to remember from today's visit:   Attention deficit disorder, unspecified hyperactivity presence  Today we went back to Adderall. We will follow in 6 weeks, before if needed. Amphetamine; Dextroamphetamine tablets What is this medicine? AMPHETAMINE; DEXTROAMPHETAMINE(am FET a meen; dex troe am FET a meen) is used to treat attention-deficit hyperactivity disorder (ADHD). It may also be used for narcolepsy. Federal law prohibits giving this medicine to any person other than the person for whom it was prescribed. Do not share this medicine with anyone else. This medicine may be used for other purposes; ask your health care provider or pharmacist if you have questions. COMMON BRAND NAME(S): Adderall What should I tell my health care provider before I take this medicine? They need to know if you have any of these conditions: -anxiety or panic attacks -circulation problems in fingers and toes -glaucoma -hardening or blockages of the arteries or heart blood vessels -heart disease or a heart defect -high blood pressure -history of a drug or alcohol abuse problem -history of stroke -kidney disease -liver disease -mental illness -seizures -suicidal thoughts, plans, or attempt; a previous suicide attempt by you or a family member -thyroid disease -Tourette's syndrome -an unusual or allergic reaction to dextroamphetamine, other amphetamines, other medicines, foods, dyes, or preservatives -pregnant or trying to get pregnant -breast-feeding How should I use this medicine? Take this medicine by mouth with a glass of water. Follow the directions on the prescription label. Take your doses at regular intervals. Do not take your medicine more often than directed. Do not suddenly stop your medicine. You must gradually reduce the dose or you may feel withdrawal effects. Ask your doctor or health care professional for advice. Talk to your pediatrician regarding the use of this medicine in  children. Special care may be needed. While this drug may be prescribed for children as young as 3 years for selected conditions, precautions do apply. Overdosage: If you think you have taken too much of this medicine contact a poison control center or emergency room at once. NOTE: This medicine is only for you. Do not share this medicine with others. What if I miss a dose? If you miss a dose, take it as soon as you can. If it is almost time for your next dose, take only that dose. Do not take double or extra doses. What may interact with this medicine? Do not take this medicine with any of the following medications: -MAOIS like Carbex, Eldepryl, Marplan, Nardil, and Parnate -other stimulant medicines for attention disorders, weight loss, or to stay awake This medicine may also interact with the following medications: -acetazolamide -ammonium chloride -antacids -ascorbic acid -atomoxetine -caffeine -certain medicines for blood pressure -certain medicines for depression, anxiety, or psychotic disturbances -certain medicines for seizures like carbamazepine, phenobarbital, phenytoin -certain medicines for stomach problems like cimetidine, famotidine, omeprazole, lansoprazole -cold or allergy medicines -glutamic acid -lithium -meperidine -methenamine; sodium acid phosphate -narcotic medicines for pain -norepinephrine -phenothiazines like chlorpromazine, mesoridazine, prochlorperazine, thioridazine -sodium acid phosphate -sodium bicarbonate This list may not describe all possible interactions. Give your health care provider a list of all the medicines, herbs, non-prescription drugs, or dietary supplements you use. Also tell them if you smoke, drink alcohol, or use illegal drugs. Some items may interact with your medicine. What should I watch for while using this medicine? Visit your doctor or health care professional for regular checks on your progress. This prescription requires that you  follow special procedures with your doctor and pharmacy. You will  need to have a new written prescription from your doctor every time you need a refill. This medicine may affect your concentration, or hide signs of tiredness. Until you know how this medicine affects you, do not drive, ride a bicycle, use machinery, or do anything that needs mental alertness. Tell your doctor or health care professional if this medicine loses its effects, or if you feel you need to take more than the prescribed amount. Do not change the dosage without talking to your doctor or health care professional. Decreased appetite is a common side effect when starting this medicine. Eating small, frequent meals or snacks can help. Talk to your doctor if you continue to have poor eating habits. Height and weight growth of a child taking this medicine will be monitored closely. Do not take this medicine close to bedtime. It may prevent you from sleeping. If you are going to need surgery, a MRI, CT scan, or other procedure, tell your doctor that you are taking this medicine. You may need to stop taking this medicine before the procedure. Tell your doctor or healthcare professional right away if you notice unexplained wounds on your fingers and toes while taking this medicine. You should also tell your healthcare provider if you experience numbness or pain, changes in the skin color, or sensitivity to temperature in your fingers or toes. What side effects may I notice from receiving this medicine? Side effects that you should report to your doctor or health care professional as soon as possible: -allergic reactions like skin rash, itching or hives, swelling of the face, lips, or tongue -changes in vision -chest pain or chest tightness -confusion, trouble speaking or understanding -fast, irregular heartbeat -fingers or toes feel numb, cool, painful -hallucination, loss of contact with reality -high blood pressure -males: prolonged  or painful erection -seizures -severe headaches -shortness of breath -suicidal thoughts or other mood changes -trouble walking, dizziness, loss of balance or coordination -uncontrollable head, mouth, neck, arm, or leg movements Side effects that usually do not require medical attention (report to your doctor or health care professional if they continue or are bothersome): -anxious -headache -loss of appetite -nausea, vomiting -trouble sleeping -weight loss This list may not describe all possible side effects. Call your doctor for medical advice about side effects. You may report side effects to FDA at 1-800-FDA-1088. Where should I keep my medicine? Keep out of the reach of children. This medicine can be abused. Keep your medicine in a safe place to protect it from theft. Do not share this medicine with anyone. Selling or giving away this medicine is dangerous and against the law. Store at room temperature between 15 and 30 degrees C (59 and 86 degrees F). Keep container tightly closed. Throw away any unused medicine after the expiration date. Dispose of properly. This medicine may cause accidental overdose and death if it is taken by other adults, children, or pets. Mix any unused medicine with a substance like cat litter or coffee grounds. Then throw the medicine away in a sealed container like a sealed bag or a coffee can with a lid. Do not use the medicine after the expiration date. NOTE: This sheet is a summary. It may not cover all possible information. If you have questions about this medicine, talk to your doctor, pharmacist, or health care provider.  2018 Elsevier/Gold Standard (2013-12-21 18:44:41)   Please be sure medication list is accurate. If a new problem present, please set up appointment sooner than planned today.

## 2017-07-17 ENCOUNTER — Ambulatory Visit: Payer: 59 | Admitting: Family Medicine

## 2017-08-13 ENCOUNTER — Other Ambulatory Visit: Payer: Self-pay | Admitting: Family Medicine

## 2017-08-13 NOTE — Telephone Encounter (Signed)
LOV 07/15/17 Dr. Martinique Last refill 07/15/17  # 30 See pt.'s request.

## 2017-08-13 NOTE — Telephone Encounter (Signed)
Copied from [place] [id number]. Topic: Quick Communication - Rx Refill/Question >> Aug 13, 2017 11:18 AM Megan Lewis wrote: Medication: amphetamine-dextroamphetamine (ADDERALL XR) 30 MG 24 hr capsule [[id number]]   Pt called to let pcp know that she is cancelling her appt for tomorrow Lewis/c her father has passed away as of yesterday, pt will call to reschedule; pt call to see if pcp can give a refill of a temporary supply when she runs out unitl she can call next week to come in and see Dr. Martinique

## 2017-08-14 ENCOUNTER — Ambulatory Visit: Payer: 59 | Admitting: Family Medicine

## 2017-08-14 NOTE — Telephone Encounter (Signed)
Message sent to Dr. Jordan for review and approval. 

## 2017-08-17 MED ORDER — AMPHETAMINE-DEXTROAMPHET ER 30 MG PO CP24: 30.0000 mg | ORAL_CAPSULE | Freq: Every day | ORAL | 0 refills | Status: DC

## 2017-09-18 ENCOUNTER — Ambulatory Visit: Payer: 59 | Admitting: Family Medicine

## 2017-09-18 ENCOUNTER — Encounter: Payer: Self-pay | Admitting: Family Medicine

## 2017-09-18 VITALS — BP 122/80 | HR 83 | Temp 98.5°F | Resp 12 | Ht 64.0 in | Wt 155.0 lb

## 2017-09-18 DIAGNOSIS — F988 Other specified behavioral and emotional disorders with onset usually occurring in childhood and adolescence: Secondary | ICD-10-CM | POA: Diagnosis not present

## 2017-09-18 MED ORDER — AMPHETAMINE-DEXTROAMPHET ER 30 MG PO CP24: 30.0000 mg | ORAL_CAPSULE | Freq: Every day | ORAL | 0 refills | Status: DC

## 2017-09-18 NOTE — Progress Notes (Signed)
[name] is a 37 y.o.female, who is here today to follow on ADD.  Currently she is on Adderall XL 30 mg daily. Last visit, 07/15/2017, we stopped Vyvanse, which was not helping, and started on Adderall. She is doing very well with Adderall, she noted great improvement in concentration and able to focus longer.  She is tolerating medication well, no side effects reported except for dry mouth, increased water intake and it helps.  She takes medication from Monday through Friday and during weekends if she ha needed. Denies worsening depression or anxiety symptoms. No difficulty sleeping.  She has no new concerns today.  Review of Systems  Constitutional: Negative for activity change, appetite change and fever.  HENT: Negative for nosebleeds.   Respiratory: Negative for shortness of breath and wheezing.   Cardiovascular: Negative for chest pain and palpitations.  Gastrointestinal: Negative for abdominal pain, nausea and vomiting.       No changes in bowel habits.  Neurological: Negative for tremors and headaches.  Psychiatric/Behavioral: Negative for behavioral problems and sleep disturbance. The patient is not nervous/anxious.        Current Outpatient Medications on File Prior to Visit  Medication Sig Dispense Refill  . acetaminophen (TYLENOL) 500 MG tablet Take 1,000 mg by mouth every 6 (six) hours as needed for moderate pain.    . pantoprazole (PROTONIX) 20 MG tablet Take 1 tablet (20 mg total) by mouth daily. 90 tablet 1  . valACYclovir (VALTREX) 500 MG tablet 4 TABLETS BY MOUTH AT FIRST SIGHT OF OUTBREAK AND 4 TABS 6 HOURS LATER  2   No current facility-administered medications on file prior to visit.      Past Medical History:  Diagnosis Date  . GERD (gastroesophageal reflux disease)   . Hypertension   . Seizures (HCC)     Allergies  Allergen Reactions  . Sulfa Antibiotics Anaphylaxis  . Nsaids Other (See Comments)    Severe burning from stomach  to back    Social History   Socioeconomic History  . Marital status: Married    Spouse name: Not on file  . Number of children: Not on file  . Years of education: Not on file  . Highest education level: Not on file  Occupational History  . Not on file  Social Needs  . Financial resource strain: Not on file  . Food insecurity:    Worry: Not on file    Inability: Not on file  . Transportation needs:    Medical: Not on file    Non-medical: Not on file  Tobacco Use  . Smoking status: Never Smoker  . Smokeless tobacco: Never Used  Substance and Sexual Activity  . Alcohol use: Yes  . Drug use: No  . Sexual activity: Yes  Lifestyle  . Physical activity:    Days per week: Not on file    Minutes per session: Not on file  . Stress: Not on file  Relationships  . Social connections:    Talks on phone: Not on file    Gets together: Not on file    Attends religious service: Not on file    Active member of club or organization: Not on file    Attends meetings of clubs or organizations: Not on file    Relationship status: Not on file  Other Topics Concern  . Not on file  Social History Narrative  . Not on file    Vitals:   09/18/17 1202  BP:  122/80  Pulse: 83  Resp: 12  Temp: 98.5 F (36.9 C)  SpO2: 99%   Body mass index is 26.61 kg/m.   Physical Exam  Nursing note and vitals reviewed. Constitutional: She is oriented to person, place, and time. She appears well-developed and well-nourished. No distress.  HENT:  Head: Normocephalic.  Mouth/Throat: Oropharynx is clear and moist and mucous membranes are normal.  Eyes: Conjunctivae are normal.  Cardiovascular: Normal rate and regular rhythm.  No murmur heard. Respiratory: Effort normal and breath sounds normal. No respiratory distress.  Musculoskeletal: She exhibits no edema.  Neurological: She is alert and oriented to person, place, and time. She has normal strength. Gait normal.  Skin: Skin is warm. No erythema.    Psychiatric: She has a normal mood and affect. Cognition and memory are normal.  Well groomed, good eye contact.    Assessment and plan  [name] was seen today for follow-up.  Diagnoses and all orders for this visit:  Attention deficit disorder, unspecified hyperactivity presence  Other orders -     Discontinue: amphetamine-dextroamphetamine (ADDERALL XR) 30 MG 24 hr capsule; Take 1 capsule (30 mg total) by mouth daily. -     Discontinue: amphetamine-dextroamphetamine (ADDERALL XR) 30 MG 24 hr capsule; Take 1 capsule (30 mg total) by mouth daily. -     amphetamine-dextroamphetamine (ADDERALL XR) 30 MG 24 hr capsule; Take 1 capsule (30 mg total) by mouth daily.   Symptoms well controlled, no changes in current management. Medication contract to be signed next visit. Rx x 3 sent to her pharmacy. Side effects of medication reviewed. F/U in 5-6 months.     Megan [name], MD  [facility]. [place] office.

## 2017-09-18 NOTE — Patient Instructions (Signed)
A few things to remember from today's visit:   Attention deficit disorder, unspecified hyperactivity presence  So we will continue with Adderall. I will see her back in 5 months, before if you have any other problems. We will plan on signing a medication contract next visit. I will send 3 prescriptions to your pharmacy.  Please be sure medication list is accurate. If a new problem present, please set up appointment sooner than planned today.

## 2017-12-22 ENCOUNTER — Other Ambulatory Visit: Payer: Self-pay | Admitting: Family Medicine

## 2017-12-28 ENCOUNTER — Other Ambulatory Visit: Payer: Self-pay | Admitting: Family Medicine

## 2017-12-28 MED ORDER — AMPHETAMINE-DEXTROAMPHET ER 30 MG PO CP24: 30.0000 mg | ORAL_CAPSULE | Freq: Every day | ORAL | 0 refills | Status: DC

## 2017-12-28 NOTE — Telephone Encounter (Signed)
Unable to lm, mailbox full. My chart message sent to patient informing that Rx was sent to the pharmacy.

## 2017-12-28 NOTE — Telephone Encounter (Signed)
Patient calling to check the status of this refill. Please advise. Patient would like a call with an update.

## 2018-01-25 ENCOUNTER — Other Ambulatory Visit: Payer: Self-pay | Admitting: Family Medicine

## 2018-01-25 NOTE — Telephone Encounter (Signed)
Copied from [place] [id number]. Topic: Quick Communication - Rx Refill/Question >> Jan 25, 2018 11:34 AM Burchel, Abbi R wrote: Medication: amphetamine-dextroamphetamine (ADDERALL XR) 30 MG 24 hr capsule  Has the patient contacted their pharmacy? No.  Preferred Pharmacy: [facility], [place] [address] [place] [address] Phone: [phone] Fax: [phone]   Pt was  advised that RX refills may take up to 3 business days. We ask that you follow-up with your pharmacy.

## 2018-01-27 NOTE — Telephone Encounter (Signed)
Spoke with pharmacy and they stated that she has refills at the pharmacy that can be refilled on Friday, 01/29/18. Tried calling patient to let her know that she has refills at the pharmacy and get it refilled on 01/29/18, unable to leave message, mailbox is full.

## 2018-01-27 NOTE — Telephone Encounter (Signed)
She is supposed to have Rx's available until 02/17/18 (Rx#3). Thanks, BJ

## 2018-03-19 ENCOUNTER — Ambulatory Visit: Payer: 59 | Admitting: Family Medicine

## 2018-03-19 ENCOUNTER — Encounter: Payer: Self-pay | Admitting: *Deleted

## 2018-03-19 ENCOUNTER — Encounter: Payer: Self-pay | Admitting: Family Medicine

## 2018-03-19 VITALS — BP 120/74 | HR 83 | Temp 98.3°F | Resp 12 | Ht 64.0 in | Wt 159.0 lb

## 2018-03-19 DIAGNOSIS — F988 Other specified behavioral and emotional disorders with onset usually occurring in childhood and adolescence: Secondary | ICD-10-CM | POA: Diagnosis not present

## 2018-03-19 DIAGNOSIS — Z5181 Encounter for therapeutic drug level monitoring: Secondary | ICD-10-CM | POA: Diagnosis not present

## 2018-03-19 MED ORDER — AMPHETAMINE-DEXTROAMPHET ER 30 MG PO CP24: 30.0000 mg | ORAL_CAPSULE | Freq: Every day | ORAL | 0 refills | Status: DC

## 2018-03-19 NOTE — Patient Instructions (Signed)
A few things to remember from today's visit:   Attention deficit disorder, unspecified hyperactivity presence - Plan: amphetamine-dextroamphetamine (ADDERALL XR) 30 MG 24 hr capsule, ToxASSURE Select 13 (MW), Urine  Encounter for medication monitoring - Plan: ToxASSURE Select 13 (MW), Urine   Please be sure medication list is accurate. If a new problem present, please set up appointment sooner than planned today.

## 2018-03-19 NOTE — Progress Notes (Signed)
Megan Lewis is a 38 y.o.female, who is here today to follow on ADD.  Currently she is on Adderall XR 30 mg daily. She is tolerating medication well, no side effects reported. She takes medication daily. Denies depression or worsening anxiety symptoms. No difficulty sleeping.  She denies any illicit drug use and keeps medication in a safe place.  Depression screen [facility] 2/9 03/19/2018 07/15/2017  Decreased Interest 0 0  Down, Depressed, Hopeless 0 0  PHQ - 2 Score 0 0  Altered sleeping 0 -  Tired, decreased energy 0 -  Change in appetite 0 -  Feeling bad or failure about yourself  0 -  Trouble concentrating 0 -  Moving slowly or fidgety/restless 0 -  Suicidal thoughts 0 -  PHQ-9 Score 0 -  Difficult doing work/chores Not difficult at all -   GAD 7 : Generalized Anxiety Score 03/19/2018  Nervous, Anxious, on Edge 0  Control/stop worrying 0  Worry too much - different things 0  Trouble relaxing 0  Restless 0  Easily annoyed or irritable 0  Afraid - awful might happen 0  Total GAD 7 Score 0  Anxiety Difficulty Not difficult at all      Adult ADHD Self Report Scale (most recent)    Adult ADHD Self-Report Scale (ASRS-v1.1) Symptom Checklist - 03/19/18 1726      Part A   1. How often do you have trouble wrapping up the final details of a project, once the challenging parts have been done?  Rarely  2. How often do you have difficulty getting things done in order when you have to do a task that requires organization?  Rarely    3. How often do you have problems remembering appointments or obligations?  Rarely  4. When you have a task that requires a lot of thought, how often do you avoid or delay getting started?  Rarely    5. How often do you fidget or squirm with your hands or feet when you have to sit down for a long time?  Sometimes  6. How often do you feel overly active and compelled to do things, like you were driven by a motor?  Rarely      Part B   7. How  often do you make careless mistakes when you have to work on a boring or difficult project?  Rarely  8. How often do you have difficulty keeping your attention when you are doing boring or repetitive work?  Sometimes    9. How often do you have difficulty concentrating on what people say to you, even when they are speaking to you directly?  Rarely  10. How often do you misplace or have difficulty finding things at home or at work?  Sometimes    11. How often are you distracted by activity or noise around you?  Rarely  12. How often do you leave your seat in meetings or other situations in which you are expected to remain seated?  Rarely    13. How often do you feel restless or fidgety?  Rarely  14. How often do you have difficulty unwinding and relaxing when you have time to yourself?  Never    15. How often do you find yourself talking too much when you are in social situations?  Rarely  16. When you are in a conversation, how often do you find yourself finishing the sentences of the people you are talking to, before they  can finish them themselves?  (!) Sometimes    17. How often do you have difficulty waiting your turn in situations when turn taking is required?  Rarely  18. How often do you interrupt others when they are busy?  Rarely        Review of Systems  Constitutional: Negative for activity change, appetite change, fatigue and unexpected weight change.  Eyes: Negative for visual disturbance.  Cardiovascular: Negative for chest pain, palpitations and leg swelling.  Gastrointestinal: Negative for abdominal pain, nausea and vomiting.  Neurological: Negative for tremors, weakness, numbness and headaches.  Psychiatric/Behavioral: Negative for behavioral problems, confusion, hallucinations and sleep disturbance. The patient is not nervous/anxious.      Current Outpatient Medications on File Prior to Visit  Medication Sig Dispense Refill  . acetaminophen (TYLENOL) 500 MG tablet Take 1,000  mg by mouth every 6 (six) hours as needed for moderate pain.    . pantoprazole (PROTONIX) 20 MG tablet Take 1 tablet (20 mg total) by mouth daily. 90 tablet 1  . valACYclovir (VALTREX) 500 MG tablet 4 TABLETS BY MOUTH AT FIRST SIGHT OF OUTBREAK AND 4 TABS 6 HOURS LATER  2   No current facility-administered medications on file prior to visit.      Past Medical History:  Diagnosis Date  . GERD (gastroesophageal reflux disease)   . Hypertension   . Seizures (HCC)     Allergies  Allergen Reactions  . Sulfa Antibiotics Anaphylaxis  . Nsaids Other (See Comments)    Severe burning from stomach to back    Social History   Socioeconomic History  . Marital status: Married    Spouse name: Not on file  . Number of children: Not on file  . Years of education: Not on file  . Highest education level: Not on file  Occupational History  . Not on file  Social Needs  . Financial resource strain: Not on file  . Food insecurity:    Worry: Not on file    Inability: Not on file  . Transportation needs:    Medical: Not on file    Non-medical: Not on file  Tobacco Use  . Smoking status: Never Smoker  . Smokeless tobacco: Never Used  Substance and Sexual Activity  . Alcohol use: Yes  . Drug use: No  . Sexual activity: Yes  Lifestyle  . Physical activity:    Days per week: Not on file    Minutes per session: Not on file  . Stress: Not on file  Relationships  . Social connections:    Talks on phone: Not on file    Gets together: Not on file    Attends religious service: Not on file    Active member of club or organization: Not on file    Attends meetings of clubs or organizations: Not on file    Relationship status: Not on file  Other Topics Concern  . Not on file  Social History Narrative  . Not on file    Vitals:   03/19/18 0937  BP: 120/74  Pulse: 83  Resp: 12  Temp: 98.3 F (36.8 C)   Body mass index is 27.29 kg/m.   Physical Exam  Nursing note and vitals  reviewed. Constitutional: She is oriented to person, place, and time. She appears well-developed and well-nourished. No distress.  HENT:  Mouth/Throat: Oropharynx is clear and moist and mucous membranes are normal.  Eyes: Pupils are equal, round, and reactive to light. Conjunctivae and EOM are  normal.  Cardiovascular: Normal rate and regular rhythm.  No murmur heard. Respiratory: Effort normal and breath sounds normal. No respiratory distress.  GI: Soft.  Musculoskeletal:        General: No edema.  Neurological: She is alert and oriented to person, place, and time. She has normal strength. Gait normal.  Skin: Skin is warm. No erythema.  Psychiatric: She has a normal mood and affect.  Well groomed, good eye contact.     Assessment and plan  Megan Lewis was seen today for follow-up.  Diagnoses and all orders for this visit:  Attention deficit disorder, unspecified hyperactivity presence -     amphetamine-dextroamphetamine (ADDERALL XR) 30 MG 24 hr capsule; Take 1 capsule (30 mg total) by mouth daily. -     ToxASSURE Select 13 (MW), Urine  Encounter for medication monitoring -     ToxASSURE Select 13 (MW), Urine    Symptoms well controlled, no changes in current management. Medication contract signed today. Urine tox today. Rx x 3 sent to her pharmacy. Side effects of medication reviewed. F/U in 5-6 months.     Mayo Faulk [name], MD  [facility]. [place] office.

## 2018-03-25 LAB — TOXASSURE SELECT 13 (MW), URINE

## 2018-03-31 ENCOUNTER — Other Ambulatory Visit: Payer: Self-pay | Admitting: Family Medicine

## 2018-03-31 ENCOUNTER — Telehealth: Payer: Self-pay | Admitting: Family Medicine

## 2018-03-31 DIAGNOSIS — F988 Other specified behavioral and emotional disorders with onset usually occurring in childhood and adolescence: Secondary | ICD-10-CM

## 2018-03-31 MED ORDER — AMPHETAMINE-DEXTROAMPHET ER 30 MG PO CP24: 30.0000 mg | ORAL_CAPSULE | Freq: Every day | ORAL | 0 refills | Status: DC

## 2018-03-31 NOTE — Telephone Encounter (Signed)
Copied from [place] [id number]. Topic: Quick Communication - Rx Refill/Question >> Mar 31, 2018 11:49 AM Virl Axe D wrote: Medication: amphetamine-dextroamphetamine (ADDERALL XR) 30 MG 24 hr capsule / Pt stated she was given paper rx for adderall at her last OV however her husband accidentally shredded it before she could take it to the pharmacy to fill. She would like to know if another rx can be written for her to pick up or faxed over to pharmacy. She is going out of town at [employer] morning and needs to refill rx before then. Please reach out to pt. CB#[phone]  Has the patient contacted their pharmacy? Yes.   (Agent: If no, request that the patient contact the pharmacy for the refill.) (Agent: If yes, when and what did the pharmacy advise?)  Preferred Pharmacy (with phone number or street name): [facility] [place], [place] [phone] (Phone) [phone] (Fax)  Agent: Please be advised that RX refills may take up to 3 business days. We ask that you follow-up with your pharmacy.

## 2018-03-31 NOTE — Telephone Encounter (Signed)
Prescription for Adderall was sent to [facility]. Betty Martinique, MD

## 2018-03-31 NOTE — Telephone Encounter (Signed)
Message sent to Dr. Jordan for review and approval. 

## 2018-03-31 NOTE — Telephone Encounter (Signed)
Copied from [place] [id number]. Topic: Quick Communication - Rx Refill/Question >> Mar 31, 2018  2:45 PM Windy Kalata wrote: Medication: amphetamine-dextroamphetamine (ADDERALL XR) 30 MG 24 hr capsule  Has the patient contacted their pharmacy? Yes.   (Agent: If no, request that the patient contact the pharmacy for the refill.) (Agent: If yes, when and what did the pharmacy advise?) [facility] can [facility] be [facility] [address]  Preferred Pharmacy (with phone number or street name): [id number] [place], [place] - [address] [phone] (Phone) [phone] (Fax)    Agent: Please be advised that RX refills may take up to 3 business days. We ask that you follow-up with your pharmacy.

## 2018-03-31 NOTE — Telephone Encounter (Signed)
Patient returned call and stated that her pharmacy did not have medication in stock and she is flying out of town in the morning and would like [facility] [address].

## 2018-03-31 NOTE — Telephone Encounter (Signed)
Left detailed message for patient that Rx was electronically sent to pharmacy on 03/19/2018 with refill date of 04/01/2018 with receipt of confirmation.

## 2018-04-01 NOTE — Telephone Encounter (Signed)
[facility] was [facility] [address] as requested on 03/31/2018 by Dr. Martinique.

## 2018-04-01 NOTE — Telephone Encounter (Signed)
Noted. Nothing further needed at this time.  

## 2018-05-03 ENCOUNTER — Other Ambulatory Visit: Payer: Self-pay | Admitting: Family Medicine

## 2018-05-03 DIAGNOSIS — F988 Other specified behavioral and emotional disorders with onset usually occurring in childhood and adolescence: Secondary | ICD-10-CM

## 2018-05-03 NOTE — Telephone Encounter (Signed)
Adderall XR refill request

## 2018-05-03 NOTE — Telephone Encounter (Signed)
Copied from [place] #[id number]. Topic: Quick Communication - Rx Refill/Question >> May 03, 2018  1:05 PM Alanda Slim E wrote: Medication: amphetamine-dextroamphetamine (ADDERALL XR) 30 MG 24 hr capsule - please add refills   Has the patient contacted their pharmacy? Yes- advised she had no refills   Preferred Pharmacy (with phone number or street name): [facility] [place], [place] - [address] [phone] (Phone) [phone] (Fax)    Agent: Please be advised that RX refills may take up to 3 business days. We ask that you follow-up with your pharmacy.

## 2018-05-05 MED ORDER — AMPHETAMINE-DEXTROAMPHET ER 30 MG PO CP24: 30.0000 mg | ORAL_CAPSULE | Freq: Every day | ORAL | 0 refills | Status: DC

## 2018-06-07 ENCOUNTER — Other Ambulatory Visit: Payer: Self-pay | Admitting: Family Medicine

## 2018-06-07 DIAGNOSIS — F988 Other specified behavioral and emotional disorders with onset usually occurring in childhood and adolescence: Secondary | ICD-10-CM

## 2018-06-08 MED ORDER — AMPHETAMINE-DEXTROAMPHET ER 30 MG PO CP24: 30.0000 mg | ORAL_CAPSULE | Freq: Every day | ORAL | 0 refills | Status: DC

## 2018-07-08 ENCOUNTER — Other Ambulatory Visit: Payer: Self-pay | Admitting: Family Medicine

## 2018-07-08 DIAGNOSIS — F988 Other specified behavioral and emotional disorders with onset usually occurring in childhood and adolescence: Secondary | ICD-10-CM

## 2018-07-09 MED ORDER — AMPHETAMINE-DEXTROAMPHET ER 30 MG PO CP24: 30.0000 mg | ORAL_CAPSULE | Freq: Every day | ORAL | 0 refills | Status: DC

## 2018-08-10 ENCOUNTER — Other Ambulatory Visit: Payer: Self-pay | Admitting: Family Medicine

## 2018-08-10 DIAGNOSIS — F988 Other specified behavioral and emotional disorders with onset usually occurring in childhood and adolescence: Secondary | ICD-10-CM

## 2018-08-10 MED ORDER — AMPHETAMINE-DEXTROAMPHET ER 30 MG PO CP24: 30.0000 mg | ORAL_CAPSULE | Freq: Every day | ORAL | 0 refills | Status: DC

## 2018-09-14 ENCOUNTER — Other Ambulatory Visit: Payer: Self-pay

## 2018-09-14 ENCOUNTER — Encounter: Payer: Self-pay | Admitting: Family Medicine

## 2018-09-14 ENCOUNTER — Ambulatory Visit (INDEPENDENT_AMBULATORY_CARE_PROVIDER_SITE_OTHER): Payer: 59 | Admitting: Family Medicine

## 2018-09-14 DIAGNOSIS — F988 Other specified behavioral and emotional disorders with onset usually occurring in childhood and adolescence: Secondary | ICD-10-CM

## 2018-09-14 DIAGNOSIS — F419 Anxiety disorder, unspecified: Secondary | ICD-10-CM | POA: Diagnosis not present

## 2018-09-14 MED ORDER — AMPHETAMINE-DEXTROAMPHET ER 30 MG PO CP24: 30.0000 mg | ORAL_CAPSULE | Freq: Every day | ORAL | 0 refills | Status: DC

## 2018-09-14 NOTE — Assessment & Plan Note (Signed)
She reported improvement in symptoms. For now mycology treatment is not needed. We discussed some side effects of Adderall.

## 2018-09-14 NOTE — Progress Notes (Signed)
Virtual Visit via Video Note   I connected with Megan Lewis on 09/14/18 at 10:00 AM EDT by a video enabled telemedicine application and verified that I am speaking with the correct person using two identifiers.  Location patient: home Location provider:office Persons participating in the virtual visit: patient, provider  I discussed the limitations of evaluation and management by telemedicine and the availability of in person appointments. The patient expressed understanding and agreed to proceed.   HPI: Megan Beever is a 38 yo female who is following on ADD. Last OV 03/19/2018. Rx last refilled 08/11/18. Last med contract: 03/23/18. Currently she is on Adderall XL 30 mg daily, medication seems to work better since she has been taking it before breakfast.  She feels like medication is still helping, she is not procrastinating and she feels like she is more efficient at work. She denies headache, chest pain, palpitation, dyspnea, abdominal pain, tremor, tics,or other side effect.  History of anxiety and depression, she denies symptoms. Negative for suicidal thoughts. Sleeping much better.  She has no other concerns today.   ROS: See pertinent positives and negatives per HPI.  Past Medical History:  Diagnosis Date  . GERD (gastroesophageal reflux disease)   . Hypertension   . Seizures ([place])     No past surgical history on file.  Family History  Problem Relation Age of Onset  . Hyperlipidemia Mother   . Heart disease Mother   . Hypertension Mother   . Hyperlipidemia Father   . Heart disease Father   . Hypertension Father   . Cancer Maternal Grandmother        breast  . Cancer Paternal Grandfather        lung    Social History   Socioeconomic History  . Marital status: Married    Spouse name: Not on file  . Number of children: Not on file  . Years of education: Not on file  . Highest education level: Not on file  Occupational History  . Not on file  Social  Needs  . Financial resource strain: Not on file  . Food insecurity    Worry: Not on file    Inability: Not on file  . Transportation needs    Medical: Not on file    Non-medical: Not on file  Tobacco Use  . Smoking status: Never Smoker  . Smokeless tobacco: Never Used  Substance and Sexual Activity  . Alcohol use: Yes  . Drug use: No  . Sexual activity: Yes  Lifestyle  . Physical activity    Days per week: Not on file    Minutes per session: Not on file  . Stress: Not on file  Relationships  . Social Herbalist on phone: Not on file    Gets together: Not on file    Attends religious service: Not on file    Active member of club or organization: Not on file    Attends meetings of clubs or organizations: Not on file    Relationship status: Not on file  . Intimate partner violence    Fear of current or ex partner: Not on file    Emotionally abused: Not on file    Physically abused: Not on file    Forced sexual activity: Not on file  Other Topics Concern  . Not on file  Social History Narrative  . Not on file     Current Outpatient Medications:  .  acetaminophen (TYLENOL) 500 MG tablet, Take  1,000 mg by mouth every 6 (six) hours as needed for moderate pain., Disp: , Rfl:  .  amphetamine-dextroamphetamine (ADDERALL XR) 30 MG 24 hr capsule, Take 1 capsule (30 mg total) by mouth daily., Disp: 30 capsule, Rfl: 0 .  pantoprazole (PROTONIX) 20 MG tablet, Take 1 tablet (20 mg total) by mouth daily., Disp: 90 tablet, Rfl: 1 .  valACYclovir (VALTREX) 500 MG tablet, 4 TABLETS BY MOUTH AT FIRST SIGHT OF OUTBREAK AND 4 TABS 6 HOURS LATER, Disp: , Rfl: 2  EXAM:  VITALS per patient if applicable:N/A  GENERAL: alert, oriented, appears well and in no acute distress  HEENT: atraumatic, normocephalic, conjunctiva clear.  LUNGS: on inspection no signs of respiratory distress, breathing rate appears normal, no obvious gross SOB, gasping or wheezing  CV: no obvious  cyanosis  PSYCH/NEURO: pleasant and cooperative, no obvious depression or anxiety, speech and thought processing grossly intact  ASSESSMENT AND PLAN:  Discussed the following assessment and plan:  ADD (attention deficit disorder) Problem is well controlled. Continue Adderall XL 30 mg daily, Rx's x 2 sent. Some side effects reviewed. Med contract up-to-date. [place] controlled substance report website was reviewed. We will plan on urine tox next office visit. Follow-up in 5 to 6 months.  Anxiety disorder, unspecified She reported improvement in symptoms. For now mycology treatment is not needed. We discussed some side effects of Adderall.   I discussed the assessment and treatment plan with the patient. She was provided an opportunity to ask questions and all were answered. She agreed with the plan and demonstrated an understanding of the instructions.     No follow-ups on file.     Martinique, MD

## 2018-09-14 NOTE — Assessment & Plan Note (Addendum)
Problem is well controlled. Continue Adderall XL 30 mg daily, Rx's x 2 sent. Some side effects reviewed. Med contract up-to-date. [place] controlled substance report website was reviewed. We will plan on urine tox next office visit. Follow-up in 5 to 6 months.

## 2018-09-16 ENCOUNTER — Encounter: Payer: Self-pay | Admitting: Family Medicine

## 2018-09-16 ENCOUNTER — Ambulatory Visit (INDEPENDENT_AMBULATORY_CARE_PROVIDER_SITE_OTHER): Payer: 59 | Admitting: Family Medicine

## 2018-09-16 DIAGNOSIS — L237 Allergic contact dermatitis due to plants, except food: Secondary | ICD-10-CM

## 2018-09-16 MED ORDER — TRIAMCINOLONE ACETONIDE 0.1 % EX CREA: 1.0000 "application " | TOPICAL_CREAM | Freq: Two times a day (BID) | CUTANEOUS | 0 refills | Status: AC | PRN

## 2018-09-16 MED ORDER — PREDNISONE 20 MG PO TABS: ORAL_TABLET | ORAL | 0 refills | Status: AC

## 2018-09-16 MED ORDER — HYDROXYZINE HCL 25 MG PO TABS: 25.0000 mg | ORAL_TABLET | Freq: Every evening | ORAL | 0 refills | Status: AC | PRN

## 2018-09-16 NOTE — Progress Notes (Signed)
Virtual Visit via Video Note   I connected with Megan Lewis on 09/16/18 at  8:30 AM EDT by a video enabled telemedicine application and verified that I am speaking with the correct person using two identifiers.  Location patient: home Location provider:home office Persons participating in the virtual visit: patient, provider  I discussed the limitations of evaluation and management by telemedicine and the availability of in person appointments. The patient expressed understanding and agreed to proceed.   HPI: Megan Lewis is a 38 yo female c/o pruritic skin lesions for about a 5 days and that started hours after she was doing yard work,pulling out weeds,did not realize it was poison ivy on the ground. Very pruritic erythematous hives,small vesicular lesions: mandibular area,ant neck,upper extremities and abdomen.  She has had similar rash before after poison ivy exposure.  She had E-Visit through work and was prescribed  Medrol pack, completed.  Still has some interdigital crusty lesions and on forearms.  She has had some new lesions for the past couple days but getting better. Erythematous rash on right waist area getting worse.  Lesions on mandibular area and anterior aspect of neck have resolved.  She denies fever,chills,sore throat,oral lesions,cough,wheezing,dyspena,CP,diarreha,N/V.  ROS: See pertinent positives and negatives per HPI.  Past Medical History:  Diagnosis Date  . GERD (gastroesophageal reflux disease)   . Hypertension   . Seizures ([place])     No past surgical history on file.  Family History  Problem Relation Age of Onset  . Hyperlipidemia Mother   . Heart disease Mother   . Hypertension Mother   . Hyperlipidemia Father   . Heart disease Father   . Hypertension Father   . Cancer Maternal Grandmother        breast  . Cancer Paternal Grandfather        lung    Social History   Socioeconomic History  . Marital status: Married    Spouse name: Not on  file  . Number of children: Not on file  . Years of education: Not on file  . Highest education level: Not on file  Occupational History  . Not on file  Social Needs  . Financial resource strain: Not on file  . Food insecurity    Worry: Not on file    Inability: Not on file  . Transportation needs    Medical: Not on file    Non-medical: Not on file  Tobacco Use  . Smoking status: Never Smoker  . Smokeless tobacco: Never Used  Substance and Sexual Activity  . Alcohol use: Yes  . Drug use: No  . Sexual activity: Yes  Lifestyle  . Physical activity    Days per week: Not on file    Minutes per session: Not on file  . Stress: Not on file  Relationships  . Social Herbalist on phone: Not on file    Gets together: Not on file    Attends religious service: Not on file    Active member of club or organization: Not on file    Attends meetings of clubs or organizations: Not on file    Relationship status: Not on file  . Intimate partner violence    Fear of current or ex partner: Not on file    Emotionally abused: Not on file    Physically abused: Not on file    Forced sexual activity: Not on file  Other Topics Concern  . Not on file  Social History Narrative  .  Not on file      Current Outpatient Medications:  .  acetaminophen (TYLENOL) 500 MG tablet, Take 1,000 mg by mouth every 6 (six) hours as needed for moderate pain., Disp: , Rfl:  .  amphetamine-dextroamphetamine (ADDERALL XR) 30 MG 24 hr capsule, Take 1 capsule (30 mg total) by mouth daily., Disp: 30 capsule, Rfl: 0 .  hydrOXYzine (ATARAX/VISTARIL) 25 MG tablet, Take 1 tablet (25 mg total) by mouth at bedtime as needed for up to 10 days for itching., Disp: 10 tablet, Rfl: 0 .  pantoprazole (PROTONIX) 20 MG tablet, Take 1 tablet (20 mg total) by mouth daily., Disp: 90 tablet, Rfl: 1 .  predniSONE (DELTASONE) 20 MG tablet, 3 tabs for 3 days, 2 tabs for 3 days, 1 tabs for 3 days, and 1/2 tab for 3 days. Take  tables together with breakfast., Disp: 20 tablet, Rfl: 0 .  triamcinolone cream (KENALOG) 0.1 %, Apply 1 application topically 2 (two) times daily as needed. After completing oral Prednisone., Disp: 30 g, Rfl: 0 .  valACYclovir (VALTREX) 500 MG tablet, 4 TABLETS BY MOUTH AT FIRST SIGHT OF OUTBREAK AND 4 TABS 6 HOURS LATER, Disp: , Rfl: 2  EXAM:  VITALS per patient if applicable:N/A  GENERAL: alert, oriented, appears well and in no acute distress  HEENT: atraumatic, conjunttiva clear, no obvious facial abnormalities on inspection  NECK: normal movements of the head and neck  LUNGS: on inspection no signs of respiratory distress, breathing rate appears normal, no obvious gross SOB, gasping or wheezing  CV: no obvious cyanosis  Megan: moves all visible extremities without noticeable abnormality  SKIN: Around waist, right side,there is erythema following linear pattern. Small crusty lesions in between some of her fingers right hand,and some on forearms.  PSYCH/NEURO: pleasant and cooperative, no obvious depression or anxiety, speech and thought processing grossly intact  ASSESSMENT AND PLAN:  Discussed the following assessment and plan:  Contact dermatitis due to poison ivy - Plan: predniSONE (DELTASONE) 20 MG tablet, hydrOXYzine (ATARAX/VISTARIL) 25 MG tablet, triamcinolone cream (KENALOG) 0.1 %  In general rash seem to be improving, still affecting a few area so she agrees with trying Prednisone taper. Hydroxyzine 25 mg at bedtime and Zyrtec 10 mg am.  We discussed side effects of medications. Explained that she can still have rash and pruritus for weeks after exposure to poison ivy and after completing treatment.So she can continue topical steroid after completing oral Prednsisone bid if needed.  Instructed about warning signs and f/u as needed.   I discussed the assessment and treatment plan with the patient. She was provided an opportunity to ask questions and all were answered.  She agreed with the plan and demonstrated an understanding of the instructions.   The patient was advised to call back or seek an in-person evaluation if the symptoms worsen or if the condition fails to improve as anticipated.  Return if symptoms worsen or fail to improve.     Martin[place], MD

## 2018-11-19 ENCOUNTER — Other Ambulatory Visit: Payer: Self-pay | Admitting: Family Medicine

## 2018-11-19 DIAGNOSIS — F988 Other specified behavioral and emotional disorders with onset usually occurring in childhood and adolescence: Secondary | ICD-10-CM

## 2018-11-23 MED ORDER — AMPHETAMINE-DEXTROAMPHET ER 30 MG PO CP24: 30.0000 mg | ORAL_CAPSULE | Freq: Every day | ORAL | 0 refills | Status: DC

## 2018-12-07 ENCOUNTER — Ambulatory Visit: Payer: 59 | Admitting: Family Medicine

## 2018-12-07 ENCOUNTER — Other Ambulatory Visit: Payer: Self-pay

## 2018-12-07 ENCOUNTER — Encounter: Payer: Self-pay | Admitting: Family Medicine

## 2018-12-07 VITALS — BP 118/74 | HR 88 | Temp 98.4°F | Resp 12 | Ht 64.0 in | Wt 154.2 lb

## 2018-12-07 DIAGNOSIS — L729 Follicular cyst of the skin and subcutaneous tissue, unspecified: Secondary | ICD-10-CM

## 2018-12-07 NOTE — Patient Instructions (Signed)
Epidermal Cyst  An epidermal cyst is a small, painless lump under your skin. The cyst contains a grayish-white, bad-smelling substance (keratin). Do not try to pop or open an epidermal cyst yourself. What are the causes?  A blocked hair follicle.  A hair that curls and re-enters the skin instead of growing straight out of the skin.  A blocked pore.  Irritated skin.  An injury to the skin.  Certain conditions that are passed along from parent to child (inherited).  Human papillomavirus (HPV).  Long-term sun damage to the skin. What increases the risk?  Having acne.  Being overweight.  Being 30-40 years old. What are the signs or symptoms? These cysts are usually harmless, but they can get infected. Symptoms of infection may include:  Redness.  Inflammation.  Tenderness.  Warmth.  Fever.  A grayish-white, bad-smelling substance drains from the cyst.  Pus drains from the cyst. How is this treated? In many cases, epidermal cysts go away on their own without treatment. If a cyst becomes infected, treatment may include:  Opening and draining the cyst, done by a doctor. After draining, you may need minor surgery to remove the rest of the cyst.  Antibiotic medicine.  Shots of medicines (steroids) that help to reduce inflammation.  Surgery to remove the cyst. Surgery may be done if the cyst: ? Becomes large. ? Bothers you. ? Has a chance of turning into cancer.  Do not try to open a cyst yourself. Follow these instructions at home:  Take over-the-counter and prescription medicines only as told by your doctor.  If you were prescribed an antibiotic medicine, take it it as told by your doctor. Do not stop using the antibiotic even if you start to feel better.  Keep the area around your cyst clean and dry.  Wear loose, dry clothing.  Avoid touching your cyst.  Check your cyst every day for signs of infection. Check for: ? Redness, swelling, or pain. ? Fluid  or blood. ? Warmth. ? Pus or a bad smell.  Keep all follow-up visits as told by your doctor. This is important. How is this prevented?  Wear clean, dry, clothing.  Avoid wearing tight clothing.  Keep your skin clean and dry. Take showers or baths every day. Contact a doctor if:  Your cyst has symptoms of infection.  Your condition does not improve or gets worse.  You have a cyst that looks different from other cysts you have had.  You have a fever. Get help right away if:  Redness spreads from the cyst into the area close by. Summary  An epidermal cyst is a sac made of skin tissue.  If a cyst becomes infected, treatment may include surgery to open and drain the cyst, or to remove it.  Take over-the-counter and prescription medicines only as told by your doctor.  Contact a doctor if your condition is not improving or is getting worse.  Keep all follow-up visits as told by your doctor. This is important. This information is not intended to replace advice given to you by your health care provider. Make sure you discuss any questions you have with your health care provider. Document Released: 03/27/2004 Document Revised: 06/10/2018 Document Reviewed: 11/26/2017 Elsevier Patient Education  2020 Elsevier Inc.  

## 2018-12-07 NOTE — Progress Notes (Signed)
ACUTE VISIT   HPI:  Chief Complaint  Patient presents with  . Knot on right side of head    noticed 6 months ago, has smelly discharge    [name] is a 38 y.o. female, who is here today with above compliant. She has not noted significant growth, it is not tender or pruritic. No Hx of trauma.  She has noted small amount of , "greesy" sensation and "bad" smell.  No hx of trauma. She has not noted fever,chills,night sweats,headaches,hair loss,oral lesions,sore throat, or skin rash.  She has not tried OTC treatments.  Frequently she irritates lesion when combing.   Review of Systems  Constitutional: Negative for activity change, appetite change and fatigue.  Respiratory: Negative for cough, shortness of breath, wheezing and stridor.   Gastrointestinal: Negative for nausea and vomiting.  Musculoskeletal: Negative for gait problem and myalgias.  Skin: Negative for wound.  Neurological: Negative for weakness and numbness.  Hematological: Negative for adenopathy. Does not bruise/bleed easily.  Rest see pertinent positives and negatives per HPI.   Current Outpatient Medications on File Prior to Visit  Medication Sig Dispense Refill  . acetaminophen (TYLENOL) 500 MG tablet Take 1,000 mg by mouth every 6 (six) hours as needed for moderate pain.    Marland Kitchen amphetamine-dextroamphetamine (ADDERALL XR) 30 MG 24 hr capsule Take 1 capsule (30 mg total) by mouth daily. 30 capsule 0  . pantoprazole (PROTONIX) 20 MG tablet Take 1 tablet (20 mg total) by mouth daily. 90 tablet 1  . valACYclovir (VALTREX) 500 MG tablet 4 TABLETS BY MOUTH AT FIRST SIGHT OF OUTBREAK AND 4 TABS 6 HOURS LATER  2   No current facility-administered medications on file prior to visit.      Past Medical History:  Diagnosis Date  . GERD (gastroesophageal reflux disease)   . Hypertension   . Seizures (HCC)    Allergies  Allergen Reactions  . Sulfa Antibiotics Anaphylaxis  . Nsaids Other (See  Comments)    Severe burning from stomach to back    Social History   Socioeconomic History  . Marital status: Married    Spouse name: Not on file  . Number of children: Not on file  . Years of education: Not on file  . Highest education level: Not on file  Occupational History  . Not on file  Social Needs  . Financial resource strain: Not on file  . Food insecurity    Worry: Not on file    Inability: Not on file  . Transportation needs    Medical: Not on file    Non-medical: Not on file  Tobacco Use  . Smoking status: Never Smoker  . Smokeless tobacco: Never Used  Substance and Sexual Activity  . Alcohol use: Yes  . Drug use: No  . Sexual activity: Yes  Lifestyle  . Physical activity    Days per week: Not on file    Minutes per session: Not on file  . Stress: Not on file  Relationships  . Social Herbalist on phone: Not on file    Gets together: Not on file    Attends religious service: Not on file    Active member of club or organization: Not on file    Attends meetings of clubs or organizations: Not on file    Relationship status: Not on file  Other Topics Concern  . Not on file  Social History Narrative  . Not on file  Vitals:   12/07/18 1120  BP: 118/74  Pulse: 88  Resp: 12  Temp: 98.4 F (36.9 C)  SpO2: 99%   Body mass index is 26.48 kg/m.  Physical Exam  Nursing note and vitals reviewed. Constitutional: She is oriented to person, place, and time. She appears well-developed and well-nourished. No distress.  HENT:  Head: Normocephalic and atraumatic.    Mouth/Throat: Oropharynx is clear and moist and mucous membranes are normal.  Eyes: Conjunctivae are normal.  Respiratory: Effort normal and breath sounds normal. No respiratory distress.  Musculoskeletal:        General: No edema.  Lymphadenopathy:       Head (right side): No occipital adenopathy present.       Head (left side): No occipital adenopathy present.    She has no  cervical adenopathy.  Neurological: She is alert and oriented to person, place, and time. She has normal strength. Gait normal.  Skin: Skin is warm. Lesion noted. No rash noted.  Left parietotemporal nodular lesion,soft,mildly erythematous.No local heat or active drainage, 2-3 cm,mobile. Yellowish/clear crust.  See picture.  Psychiatric: She has a normal mood and affect.  Well groomed, good eye contact.        ASSESSMENT AND PLAN:  [name] was seen today for knot on right side of head.  Diagnoses and all orders for this visit:  Scalp cyst -     Ambulatory referral to General Surgery  Sebaceous cyst. I do not thinks there is an underlying infectious process at this time,so abx is not needed. Monitor for signs of infection. Avoid scratching or squeezing lesion.  Surgery referral placed, it may be faster than derma appt.  Return if symptoms worsen or fail to improve.   Betty [name], MD  [facility]. [place] office.

## 2018-12-09 ENCOUNTER — Encounter: Payer: Self-pay | Admitting: Family Medicine

## 2018-12-20 ENCOUNTER — Other Ambulatory Visit: Payer: Self-pay | Admitting: Family Medicine

## 2018-12-20 DIAGNOSIS — F988 Other specified behavioral and emotional disorders with onset usually occurring in childhood and adolescence: Secondary | ICD-10-CM

## 2018-12-20 NOTE — Telephone Encounter (Signed)
Please see rx refill request  °

## 2018-12-22 MED ORDER — AMPHETAMINE-DEXTROAMPHET ER 30 MG PO CP24: 30.0000 mg | ORAL_CAPSULE | Freq: Every day | ORAL | 0 refills | Status: DC

## 2018-12-31 ENCOUNTER — Ambulatory Visit: Payer: Self-pay | Admitting: Surgery

## 2018-12-31 NOTE — H&P (Signed)
  History of Present Illness Megan Lewis. Jlynn Langille MD; 12/31/2018 11:13 AM) The patient is a 38 year old female who presents with a complaint of Mass. Referred by Dr. Betty Martinique for scalp mass  [name] who presents with several months of a slowly enlarging mass on the left side of her scalp. Recently she has noticed oily discharge as well as unpleasant odor coming from this area. There is minimal tenderness. She was examined by Dr. Martinique who felt that this likely represented a sebaceous cyst. She is referred to [place] for evaluation.   Problem List/Past Medical Rodman Key K. Misao Fackrell, MD; 12/31/2018 11:13 AM) SEBACEOUS CYST (L72.3)  Past Surgical History Emeline Gins, [place]; 12/31/2018 11:06 AM) No pertinent past surgical history  Diagnostic Studies History Emeline Gins, [place]; 12/31/2018 11:06 AM) Mammogram never Pap Smear 1-5 years ago  Allergies Emeline Gins, CMA; 12/31/2018 11:07 AM) Sulfa Antibiotics Allergies Reconciled  Medication History Emeline Gins, CMA; 12/31/2018 11:07 AM) Adderall XR (30MG  Capsule ER 24HR, Oral) Active. Medications Reconciled  Social History Emeline Gins, [place]; 12/31/2018 11:06 AM) Alcohol use Occasional alcohol use. Caffeine use Carbonated beverages, Coffee, Tea. No drug use Tobacco use Former smoker.  Family History Emeline Gins, [place]; 12/31/2018 11:06 AM) Arthritis Mother. Diabetes Mellitus Father. Heart Disease Father. Hypertension Father.  Pregnancy / Birth History Emeline Gins, [place]; 12/31/2018 11:06 AM) Durenda Age 1 Irregular periods Maternal age 34-35 Para 2     Review of Systems Emeline Gins CMA; 12/31/2018 11:06 AM) Respiratory Not Present- Bloody sputum, Chronic Cough, Difficulty Breathing, Snoring and Wheezing. Breast Not Present- Breast Mass, Breast Pain, Nipple Discharge and Skin Changes. Psychiatric Not Present- Anxiety, Bipolar, Change in Sleep Pattern,  Depression, Fearful and Frequent crying. Endocrine Not Present- Cold Intolerance, Excessive Hunger, Hair Changes, Heat Intolerance, Hot flashes and New Diabetes.  Vitals Emeline Gins CMA; 12/31/2018 11:07 AM) 12/31/2018 11:06 AM Weight: 157.4 lb Height: 65in Body Surface Area: 1.79 m Body Mass Index: 26.19 kg/m  Temp.: 98.76F  Pulse: 112 (Regular)  BP: 130/78 (Sitting, Left Arm, Standard)        Physical Exam Rodman Key K. Kaushik Maul MD; 12/31/2018 11:14 AM)  The physical exam findings are as follows: Note:WDWN in NAD Left parietal scalp - 1 cm protruding cyst with some minimal discharge. No cellulitis or induration.    Assessment & Plan Megan Lewis. Quatavious Rossa MD; 12/31/2018 11:12 AM)  SEBACEOUS CYST (L72.3) Impression: Left parietal scalp 1 cm  Current Plans Schedule for Surgery - Excision of sebaceous cyst - left scalp. The surgical procedure has been discussed with the patient. Potential risks, benefits, alternative treatments, and expected outcomes have been explained. All of the patient's questions at this time have been answered. The likelihood of reaching the patient's treatment goal is good. The patient understand the proposed surgical procedure and wishes to proceed.  Megan Lewis. Georgette Dover, MD, [facility]  General/ Trauma Surgery   12/31/2018 11:15 AM

## 2019-01-18 ENCOUNTER — Other Ambulatory Visit: Payer: Self-pay | Admitting: Family Medicine

## 2019-01-18 DIAGNOSIS — F988 Other specified behavioral and emotional disorders with onset usually occurring in childhood and adolescence: Secondary | ICD-10-CM

## 2019-01-18 NOTE — Telephone Encounter (Signed)
Last filled 12/22/2018 Last OV 12/07/2018

## 2019-01-19 ENCOUNTER — Other Ambulatory Visit: Payer: Self-pay | Admitting: Family Medicine

## 2019-01-19 DIAGNOSIS — F988 Other specified behavioral and emotional disorders with onset usually occurring in childhood and adolescence: Secondary | ICD-10-CM

## 2019-01-19 MED ORDER — AMPHETAMINE-DEXTROAMPHET ER 30 MG PO CP24: 30.0000 mg | ORAL_CAPSULE | Freq: Every day | ORAL | 0 refills | Status: DC

## 2019-02-09 ENCOUNTER — Encounter (HOSPITAL_BASED_OUTPATIENT_CLINIC_OR_DEPARTMENT_OTHER): Payer: Self-pay | Admitting: *Deleted

## 2019-02-09 ENCOUNTER — Other Ambulatory Visit: Payer: Self-pay

## 2019-02-12 ENCOUNTER — Other Ambulatory Visit (HOSPITAL_COMMUNITY)
Admission: RE | Admit: 2019-02-12 | Discharge: 2019-02-12 | Disposition: A | Discharge status: Home / Self Care | Payer: 59 | Source: Ambulatory Visit | Attending: Surgery | Admitting: Surgery

## 2019-02-12 DIAGNOSIS — Z20828 Contact with and (suspected) exposure to other viral communicable diseases: Secondary | ICD-10-CM | POA: Diagnosis not present

## 2019-02-12 DIAGNOSIS — Z01812 Encounter for preprocedural laboratory examination: Secondary | ICD-10-CM | POA: Diagnosis not present

## 2019-02-13 LAB — NOVEL CORONAVIRUS, NAA (HOSP ORDER, SEND-OUT TO REF LAB; TAT 18-24 HRS): SARS-CoV-2, NAA: NOT DETECTED

## 2019-02-15 ENCOUNTER — Other Ambulatory Visit: Payer: Self-pay | Admitting: Family Medicine

## 2019-02-15 DIAGNOSIS — F988 Other specified behavioral and emotional disorders with onset usually occurring in childhood and adolescence: Secondary | ICD-10-CM

## 2019-02-15 NOTE — Telephone Encounter (Signed)
Please advise 

## 2019-02-16 ENCOUNTER — Ambulatory Visit (HOSPITAL_BASED_OUTPATIENT_CLINIC_OR_DEPARTMENT_OTHER)
Admission: RE | Admit: 2019-02-16 | Discharge: 2019-02-16 | Disposition: A | Discharge status: Home / Self Care | Payer: 59 | Attending: Surgery | Admitting: Surgery

## 2019-02-16 ENCOUNTER — Ambulatory Visit (HOSPITAL_BASED_OUTPATIENT_CLINIC_OR_DEPARTMENT_OTHER): Payer: 59 | Admitting: Anesthesiology

## 2019-02-16 ENCOUNTER — Encounter (HOSPITAL_BASED_OUTPATIENT_CLINIC_OR_DEPARTMENT_OTHER)
Admission: RE | Disposition: A | Discharge status: Home / Self Care | Payer: Self-pay | Source: Home / Self Care | Attending: Surgery

## 2019-02-16 ENCOUNTER — Other Ambulatory Visit: Payer: Self-pay

## 2019-02-16 ENCOUNTER — Encounter (HOSPITAL_BASED_OUTPATIENT_CLINIC_OR_DEPARTMENT_OTHER): Payer: Self-pay | Admitting: Surgery

## 2019-02-16 DIAGNOSIS — Z882 Allergy status to sulfonamides status: Secondary | ICD-10-CM | POA: Diagnosis not present

## 2019-02-16 DIAGNOSIS — Z79899 Other long term (current) drug therapy: Secondary | ICD-10-CM | POA: Insufficient documentation

## 2019-02-16 DIAGNOSIS — Z87891 Personal history of nicotine dependence: Secondary | ICD-10-CM | POA: Insufficient documentation

## 2019-02-16 DIAGNOSIS — D234 Other benign neoplasm of skin of scalp and neck: Secondary | ICD-10-CM | POA: Diagnosis not present

## 2019-02-16 DIAGNOSIS — L723 Sebaceous cyst: Secondary | ICD-10-CM | POA: Diagnosis present

## 2019-02-16 DIAGNOSIS — F988 Other specified behavioral and emotional disorders with onset usually occurring in childhood and adolescence: Secondary | ICD-10-CM | POA: Diagnosis not present

## 2019-02-16 HISTORY — PX: CYST EXCISION: SHX5701

## 2019-02-16 HISTORY — DX: Other specified behavioral and emotional disorders with onset usually occurring in childhood and adolescence: F98.8

## 2019-02-16 LAB — POCT PREGNANCY, URINE: Preg Test, Ur: NEGATIVE

## 2019-02-16 SURGERY — CYST REMOVAL
Anesthesia: General | Site: Scalp

## 2019-02-16 MED ORDER — ONDANSETRON HCL 4 MG/2ML IJ SOLN: 4.0000 mg | Freq: Once | INTRAMUSCULAR | Status: DC | PRN

## 2019-02-16 MED ORDER — LIDOCAINE HCL (CARDIAC) PF 100 MG/5ML IV SOSY
PREFILLED_SYRINGE | INTRAVENOUS | Status: DC | PRN
  Administered 2019-02-16: 70 mg via INTRAVENOUS

## 2019-02-16 MED ORDER — MIDAZOLAM HCL 2 MG/2ML IJ SOLN
INTRAMUSCULAR | Status: DC | PRN
  Administered 2019-02-16: 2 mg via INTRAVENOUS

## 2019-02-16 MED ORDER — PHENYLEPHRINE 40 MCG/ML (10ML) SYRINGE FOR IV PUSH (FOR BLOOD PRESSURE SUPPORT)
PREFILLED_SYRINGE | INTRAVENOUS | Status: DC | PRN
  Administered 2019-02-16 (×7): 80 ug via INTRAVENOUS

## 2019-02-16 MED ORDER — ROCURONIUM BROMIDE 10 MG/ML (PF) SYRINGE
PREFILLED_SYRINGE | INTRAVENOUS | Status: DC | PRN
  Administered 2019-02-16: 30 mg via INTRAVENOUS

## 2019-02-16 MED ORDER — FENTANYL CITRATE (PF) 100 MCG/2ML IJ SOLN
INTRAMUSCULAR | Status: DC | PRN
  Administered 2019-02-16: 100 ug via INTRAVENOUS

## 2019-02-16 MED ORDER — PHENYLEPHRINE 40 MCG/ML (10ML) SYRINGE FOR IV PUSH (FOR BLOOD PRESSURE SUPPORT)
PREFILLED_SYRINGE | INTRAVENOUS | Status: AC
  Filled 2019-02-16: qty 20

## 2019-02-16 MED ORDER — SUGAMMADEX SODIUM 200 MG/2ML IV SOLN
INTRAVENOUS | Status: DC | PRN
  Administered 2019-02-16: 140 mg via INTRAVENOUS

## 2019-02-16 MED ORDER — ONDANSETRON HCL 4 MG/2ML IJ SOLN
INTRAMUSCULAR | Status: AC
  Filled 2019-02-16: qty 2

## 2019-02-16 MED ORDER — ONDANSETRON HCL 4 MG/2ML IJ SOLN
INTRAMUSCULAR | Status: DC | PRN
  Administered 2019-02-16: 4 mg via INTRAVENOUS

## 2019-02-16 MED ORDER — BUPIVACAINE HCL (PF) 0.25 % IJ SOLN
INTRAMUSCULAR | Status: DC | PRN
  Administered 2019-02-16: 2 mL

## 2019-02-16 MED ORDER — CHLORHEXIDINE GLUCONATE CLOTH 2 % EX PADS: 6.0000 | MEDICATED_PAD | Freq: Once | CUTANEOUS | Status: DC

## 2019-02-16 MED ORDER — FENTANYL CITRATE (PF) 100 MCG/2ML IJ SOLN
INTRAMUSCULAR | Status: AC
  Filled 2019-02-16: qty 2

## 2019-02-16 MED ORDER — LIDOCAINE 2% (20 MG/ML) 5 ML SYRINGE
INTRAMUSCULAR | Status: AC
  Filled 2019-02-16: qty 5

## 2019-02-16 MED ORDER — LACTATED RINGERS IV SOLN: INTRAVENOUS | Status: DC

## 2019-02-16 MED ORDER — PROPOFOL 10 MG/ML IV BOLUS
INTRAVENOUS | Status: DC | PRN
  Administered 2019-02-16: 170 mg via INTRAVENOUS

## 2019-02-16 MED ORDER — FENTANYL CITRATE (PF) 100 MCG/2ML IJ SOLN: 25.0000 ug | INTRAMUSCULAR | Status: DC | PRN

## 2019-02-16 MED ORDER — MIDAZOLAM HCL 2 MG/2ML IJ SOLN
INTRAMUSCULAR | Status: AC
  Filled 2019-02-16: qty 2

## 2019-02-16 MED ORDER — DEXAMETHASONE SODIUM PHOSPHATE 10 MG/ML IJ SOLN
INTRAMUSCULAR | Status: DC | PRN
  Administered 2019-02-16: 4 mg via INTRAVENOUS

## 2019-02-16 MED ORDER — CEFAZOLIN SODIUM-DEXTROSE 2-4 GM/100ML-% IV SOLN
2.0000 g | INTRAVENOUS | Status: AC
  Administered 2019-02-16: 11:00:00 2 g via INTRAVENOUS

## 2019-02-16 MED ORDER — ROCURONIUM BROMIDE 10 MG/ML (PF) SYRINGE
PREFILLED_SYRINGE | INTRAVENOUS | Status: AC
  Filled 2019-02-16: qty 10

## 2019-02-16 MED ORDER — CEFAZOLIN SODIUM-DEXTROSE 2-4 GM/100ML-% IV SOLN
INTRAVENOUS | Status: AC
  Filled 2019-02-16: qty 100

## 2019-02-16 SURGICAL SUPPLY — 30 items
ADH SKN CLS APL DERMABOND .7 (GAUZE/BANDAGES/DRESSINGS) ×1
BLADE SURG 15 STRL LF DISP TIS (BLADE) ×1 IMPLANT
BLADE SURG 15 STRL SS (BLADE) ×2
COVER BACK TABLE REUSABLE LG (DRAPES) ×3 IMPLANT
DERMABOND ADVANCED (GAUZE/BANDAGES/DRESSINGS) ×2
DERMABOND ADVANCED .7 DNX12 (GAUZE/BANDAGES/DRESSINGS) IMPLANT
DRAPE U-SHAPE 76X120 STRL (DRAPES) ×2 IMPLANT
ELECT NDL BLADE 2-5/6 (NEEDLE) IMPLANT
ELECT NEEDLE BLADE 2-5/6 (NEEDLE) ×3 IMPLANT
ELECT REM PT RETURN 9FT ADLT (ELECTROSURGICAL) ×3
ELECTRODE REM PT RTRN 9FT ADLT (ELECTROSURGICAL) ×1 IMPLANT
GLOVE BIO SURGEON STRL SZ 6.5 (GLOVE) ×1 IMPLANT
GLOVE BIO SURGEON STRL SZ7 (GLOVE) ×3 IMPLANT
GLOVE BIO SURGEONS STRL SZ 6.5 (GLOVE) ×1
GLOVE BIOGEL PI IND STRL 7.0 (GLOVE) IMPLANT
GLOVE BIOGEL PI IND STRL 7.5 (GLOVE) ×1 IMPLANT
GLOVE BIOGEL PI INDICATOR 7.0 (GLOVE) ×2
GLOVE BIOGEL PI INDICATOR 7.5 (GLOVE) ×2
GOWN STRL REUS W/ TWL LRG LVL3 (GOWN DISPOSABLE) ×1 IMPLANT
GOWN STRL REUS W/TWL LRG LVL3 (GOWN DISPOSABLE) ×3
NDL HYPO 25X1 1.5 SAFETY (NEEDLE) ×1 IMPLANT
NEEDLE HYPO 25X1 1.5 SAFETY (NEEDLE) ×3 IMPLANT
NS IRRIG 1000ML POUR BTL (IV SOLUTION) ×2 IMPLANT
PACK BASIN DAY SURGERY FS (CUSTOM PROCEDURE TRAY) ×3 IMPLANT
PENCIL SMOKE EVACUATOR (MISCELLANEOUS) ×3 IMPLANT
SUT MON AB 4-0 PC3 18 (SUTURE) ×2 IMPLANT
SUT VIC AB 3-0 SH 27 (SUTURE) ×3
SUT VIC AB 3-0 SH 27X BRD (SUTURE) IMPLANT
SYR CONTROL 10ML LL (SYRINGE) ×3 IMPLANT
TOWEL GREEN STERILE FF (TOWEL DISPOSABLE) ×3 IMPLANT

## 2019-02-16 NOTE — Op Note (Signed)
Preop diagnosis: Sebaceous cyst left scalp Postop diagnosis: Same Procedure performed: Excision of 1 cm sebaceous cyst left scalp Surgeon:Laekyn Rayos K Kyrese Gartman Anesthesia: General Indications:  [name] who presents with several months of a slowly enlarging mass on the left side of her scalp. Recently she has noticed oily discharge as well as unpleasant odor coming from this area. There is minimal tenderness. She was examined by Dr. Martinique who felt that this likely represented a sebaceous cyst.   Description of procedure: The patient is brought to the operating room placed in the supine position on the operating room table.  After adequate level of general anesthesia was obtained, she was moved to a lateral position on her right side.  The left side of the scalp was examined.  We used tape to hold back her hair around the area of the palpable cyst.  The cyst did not appear to be actively infected.  We prepped this area with Betadine and draped sterile fashion.  A timeout was taken.  I anesthetized with 0.25% Marcaine with epinephrine.  I made an elliptical incision and excised the entire cyst which measured about 1 cm.  Hemostasis was obtained with cautery.  I undermined the skin at flaps.  We closed the wound with a deep layer of 3-0 Vicryl and a superficial layer of 4-0 Monocryl.  Dermabond was applied.  The patient was then extubated and brought to the recovery room in stable condition.  All sponge, instrument, and needle counts are correct.  Megan Lewis. Megan Dover, MD, [facility] Surgery  General/ Trauma Surgery   02/16/2019 11:28 AM

## 2019-02-16 NOTE — Anesthesia Preprocedure Evaluation (Signed)
Anesthesia Evaluation  Patient identified by MRN, date of birth, ID band Patient awake    Reviewed: Allergy & Precautions, NPO status , Patient's Chart, lab work & pertinent test results  Airway Mallampati: II  TM Distance: >3 FB Neck ROM: Full    Dental no notable dental hx. (+) Teeth Intact   Pulmonary neg pulmonary ROS,    Pulmonary exam normal breath sounds clear to auscultation       Cardiovascular negative cardio ROS Normal cardiovascular exam Rhythm:Regular Rate:Normal     Neuro/Psych PSYCHIATRIC DISORDERS Anxiety ADDnegative neurological ROS     GI/Hepatic Neg liver ROS, GERD  Medicated and Controlled,IBS   Endo/Other  negative endocrine ROS  Renal/GU negative Renal ROS     Musculoskeletal Sebaceous Cyst left scalp   Abdominal   Peds  Hematology negative hematology ROS (+)   Anesthesia Other Findings   Reproductive/Obstetrics                             Anesthesia Physical Anesthesia Plan  ASA: II  Anesthesia Plan: General   Post-op Pain Management:    Induction: Intravenous  PONV Risk Score and Plan: 3 and Ondansetron, Treatment may vary due to age or medical condition, Dexamethasone and Midazolam  Airway Management Planned: LMA and Oral ETT  Additional Equipment:   Intra-op Plan:   Post-operative Plan: Extubation in OR  Informed Consent: I have reviewed the patients History and Physical, chart, labs and discussed the procedure including the risks, benefits and alternatives for the proposed anesthesia with the patient or authorized representative who has indicated his/her understanding and acceptance.     Dental advisory given  Plan Discussed with: CRNA, Anesthesiologist and Surgeon  Anesthesia Plan Comments:         Anesthesia Quick Evaluation

## 2019-02-16 NOTE — Discharge Instructions (Signed)
[place] Office Phone Number [phone]  POST OP INSTRUCTIONS  Always review your discharge instruction sheet given to you by the facility where your surgery was performed.  IF YOU HAVE DISABILITY OR FAMILY LEAVE FORMS, YOU MUST BRING THEM TO THE OFFICE FOR PROCESSING.  DO NOT GIVE THEM TO YOUR DOCTOR.  1. Take your usually prescribed medications unless otherwise directed 2. You should eat very light the first 24 hours after surgery, such as soup, crackers, pudding, etc.  Resume your normal diet the day after surgery. 3. Most patients will experience some swelling and bruising around the surgical site.  Ice packs will help.  Swelling and bruising can take several days to resolve.  4. You may shower in 24 hours, but do not scrub the surgical site.  Soapy water can run over this area.  The dermabond glue will begin to flake off over the next week or so. 5. ACTIVITIES:  You may resume regular daily activities (gradually increasing) beginning the next day.   You may have sexual intercourse when it is comfortable. a. You may drive when you no longer are taking prescription pain medication, you can comfortably wear a seatbelt, and you can safely maneuver your car and apply brakes. b. RETURN TO WORK:  3 days 6. You should see your doctor in the office for a follow-up appointment approximately two to three weeks after your surgery.    WHEN TO CALL YOUR DOCTOR: 1. Fever over 101.0 2. Nausea and/or vomiting. 3. Extreme swelling or bruising. 4. Continued bleeding from incision. 5. Increased pain, redness, or drainage from the incision.  The clinic staff is available to answer your questions during regular business hours.  Please don't hesitate to call and ask to speak to one of the nurses for clinical concerns.  If you have a medical emergency, go to the nearest emergency room or call 911.  A surgeon from [facility] is always on call at the hospital.  For further  questions, please visit [email or web address]     Post Anesthesia Home Care Instructions  Activity: Get plenty of rest for the remainder of the day. A responsible individual must stay with you for 24 hours following the procedure.  For the next 24 hours, DO NOT: -Drive a car -Paediatric nurse -Drink alcoholic beverages -Take any medication unless instructed by your physician -Make any legal decisions or sign important papers.  Meals: Start with liquid foods such as gelatin or soup. Progress to regular foods as tolerated. Avoid greasy, spicy, heavy foods. If nausea and/or vomiting occur, drink only clear liquids until the nausea and/or vomiting subsides. Call your physician if vomiting continues.  Special Instructions/Symptoms: Your throat may feel dry or sore from the anesthesia or the breathing tube placed in your throat during surgery. If this causes discomfort, gargle with warm salt water. The discomfort should disappear within 24 hours.  If you had a scopolamine patch placed behind your ear for the management of post- operative nausea and/or vomiting:  1. The medication in the patch is effective for 72 hours, after which it should be removed.  Wrap patch in a tissue and discard in the trash. Wash hands thoroughly with soap and water. 2. You may remove the patch earlier than 72 hours if you experience unpleasant side effects which may include dry mouth, dizziness or visual disturbances. 3. Avoid touching the patch. Wash your hands with soap and water after contact with the patch.

## 2019-02-16 NOTE — Transfer of Care (Signed)
Immediate Anesthesia Transfer of Care Note  Patient: Megan Lewis  Procedure(s) Performed: EXCISION OF SEBACEOUS CYST -SCALP (N/A Scalp)  Patient Location: PACU  Anesthesia Type:General  Level of Consciousness: awake, alert , oriented and patient cooperative  Airway & Oxygen Therapy: Patient Spontanous Breathing and Patient connected to nasal cannula oxygen  Post-op Assessment: Report given to RN and Post -op Vital signs reviewed and stable  Post vital signs: Reviewed and stable  Last Vitals:  Vitals Value Taken Time  BP 107/72 02/16/19 1139  Temp    Pulse 102 02/16/19 1141  Resp 18 02/16/19 1141  SpO2 100 % 02/16/19 1141  Vitals shown include unvalidated device data.  Last Pain:  Vitals:   02/16/19 1008  TempSrc: Oral  PainSc: 0-No pain      Patients Stated Pain Goal: 5 ([id number])  Complications: No apparent anesthesia complications

## 2019-02-16 NOTE — H&P (Signed)
  History of Present Illness  The patient is a 38 year old female who presents with a complaint of Mass. Referred by Dr. Betty Martinique for scalp mass  [name] who presents with several months of a slowly enlarging mass on the left side of her scalp. Recently she has noticed oily discharge as well as unpleasant odor coming from this area. There is minimal tenderness. She was examined by Dr. Martinique who felt that this likely represented a sebaceous cyst. She is referred to [place] for evaluation.   Problem List/Past Medical  SEBACEOUS CYST (L72.3)  Past Surgical History  No pertinent past surgical history  Diagnostic Studies History  Mammogram never Pap Smear 1-5 years ago  Allergies Sulfa Antibiotics Allergies Reconciled  Medication History  Adderall XR (30MG  Capsule ER 24HR, Oral) Active. Medications Reconciled  Social History   Alcohol use Occasional alcohol use. Caffeine use Carbonated beverages, Coffee, Tea. No drug use Tobacco use Former smoker.  Family History Arthritis Mother. Diabetes Mellitus Father. Heart Disease Father. Hypertension Father.  Pregnancy / Birth History  Gravida 2 Irregular periods Maternal age 31-35 Para 2     Review of Systems  Respiratory Not Present- Bloody sputum, Chronic Cough, Difficulty Breathing, Snoring and Wheezing. Breast Not Present- Breast Mass, Breast Pain, Nipple Discharge and Skin Changes. Psychiatric Not Present- Anxiety, Bipolar, Change in Sleep Pattern, Depression, Fearful and Frequent crying. Endocrine Not Present- Cold Intolerance, Excessive Hunger, Hair Changes, Heat Intolerance, Hot flashes and New Diabetes.  Vitals  Weight: 157.4 lb Height: 65in Body Surface Area: 1.79 m Body Mass Index: 26.19 kg/m  Temp.: 98.79F  Pulse: 112 (Regular)  BP: 130/78 (Sitting, Left Arm, Standard)        Physical Exam   The physical exam  findings are as follows: Note:WDWN in NAD Left parietal scalp - 1 cm protruding cyst with some minimal discharge. No cellulitis or induration.    Assessment & Plan  SEBACEOUS CYST (L72.3) Impression: Left parietal scalp 1 cm  Current Plans Schedule for Surgery - Excision of sebaceous cyst - left scalp. The surgical procedure has been discussed with the patient. Potential risks, benefits, alternative treatments, and expected outcomes have been explained. All of the patient's questions at this time have been answered. The likelihood of reaching the patient's treatment goal is good. The patient understand the proposed surgical procedure and wishes to proceed.    Imogene Burn. Georgette Dover, MD, [facility] Surgery  General/ Trauma Surgery   02/16/2019 9:41 AM

## 2019-02-16 NOTE — Anesthesia Postprocedure Evaluation (Signed)
Anesthesia Post Note  Patient: Megan Lewis  Procedure(s) Performed: EXCISION OF SEBACEOUS CYST -SCALP (N/A Scalp)     Patient location during evaluation: PACU Anesthesia Type: General Level of consciousness: awake and alert Pain management: pain level controlled Vital Signs Assessment: post-procedure vital signs reviewed and stable Respiratory status: spontaneous breathing, nonlabored ventilation and respiratory function stable Cardiovascular status: blood pressure returned to baseline and stable Postop Assessment: no apparent nausea or vomiting Anesthetic complications: no    Last Vitals:  Vitals:   02/16/19 1139 02/16/19 1145  BP: 107/72 108/72  Pulse: 98 (!) 113  Resp: (!) 9 15  Temp: (!) 36.2 C   SpO2: 99% 100%    Last Pain:  Vitals:   02/16/19 1145  TempSrc:   PainSc: 0-No pain                 Ariabella Brien A.

## 2019-02-16 NOTE — Anesthesia Procedure Notes (Signed)
Procedure Name: Intubation Date/Time: 02/16/2019 10:57 AM Performed by: Raenette Rover, CRNA Pre-anesthesia Checklist: Patient identified, Emergency Drugs available, Suction available and Patient being monitored Patient Re-evaluated:Patient Re-evaluated prior to induction Oxygen Delivery Method: Circle system utilized Preoxygenation: Pre-oxygenation with 100% oxygen Induction Type: IV induction Ventilation: Mask ventilation without difficulty Laryngoscope Size: Mac and 3 Grade View: Grade I Tube type: Oral Tube size: 7.0 mm Number of attempts: 1 Airway Equipment and Method: Stylet Placement Confirmation: ETT inserted through vocal cords under direct vision,  positive ETCO2 and breath sounds checked- equal and bilateral Secured at: 21 cm Tube secured with: Tape Dental Injury: Teeth and Oropharynx as per pre-operative assessment

## 2019-02-17 ENCOUNTER — Encounter: Payer: Self-pay | Admitting: *Deleted

## 2019-02-17 LAB — SURGICAL PATHOLOGY

## 2019-03-22 ENCOUNTER — Other Ambulatory Visit: Payer: Self-pay | Admitting: Family Medicine

## 2019-03-22 DIAGNOSIS — F988 Other specified behavioral and emotional disorders with onset usually occurring in childhood and adolescence: Secondary | ICD-10-CM

## 2019-03-22 NOTE — Telephone Encounter (Signed)
Last filled: 02/21/2019 Last OV: 12/2018 Next OV: Not scheduled.

## 2019-03-25 MED ORDER — AMPHETAMINE-DEXTROAMPHET ER 30 MG PO CP24: 30.0000 mg | ORAL_CAPSULE | Freq: Every day | ORAL | 0 refills | Status: DC

## 2019-04-22 ENCOUNTER — Other Ambulatory Visit: Payer: Self-pay | Admitting: Family Medicine

## 2019-04-22 DIAGNOSIS — F988 Other specified behavioral and emotional disorders with onset usually occurring in childhood and adolescence: Secondary | ICD-10-CM

## 2019-04-22 NOTE — Telephone Encounter (Signed)
Last filled 03/25/19

## 2019-04-26 MED ORDER — AMPHETAMINE-DEXTROAMPHET ER 30 MG PO CP24: 30.0000 mg | ORAL_CAPSULE | Freq: Every day | ORAL | 0 refills | Status: DC

## 2019-05-25 ENCOUNTER — Other Ambulatory Visit: Payer: Self-pay | Admitting: Family Medicine

## 2019-05-25 DIAGNOSIS — F988 Other specified behavioral and emotional disorders with onset usually occurring in childhood and adolescence: Secondary | ICD-10-CM

## 2019-05-25 NOTE — Telephone Encounter (Signed)
Last OV: 12/2018 Next OV: not scheduled Last Filled: 04/26/19

## 2019-05-27 MED ORDER — AMPHETAMINE-DEXTROAMPHET ER 30 MG PO CP24: 30.0000 mg | ORAL_CAPSULE | Freq: Every day | ORAL | 0 refills | Status: DC

## 2019-06-22 ENCOUNTER — Other Ambulatory Visit: Payer: Self-pay | Admitting: Family Medicine

## 2019-06-22 DIAGNOSIS — F988 Other specified behavioral and emotional disorders with onset usually occurring in childhood and adolescence: Secondary | ICD-10-CM

## 2019-06-22 NOTE — Telephone Encounter (Signed)
Last OV 12/07/2018  Last filled 05/27/2019, #30 with 0 refills

## 2019-06-28 ENCOUNTER — Encounter: Payer: Self-pay | Admitting: Family Medicine

## 2019-06-28 ENCOUNTER — Telehealth (INDEPENDENT_AMBULATORY_CARE_PROVIDER_SITE_OTHER): Payer: 59 | Admitting: Family Medicine

## 2019-06-28 VITALS — Ht 64.0 in

## 2019-06-28 DIAGNOSIS — F988 Other specified behavioral and emotional disorders with onset usually occurring in childhood and adolescence: Secondary | ICD-10-CM

## 2019-06-28 MED ORDER — AMPHETAMINE-DEXTROAMPHET ER 30 MG PO CP24: 30.0000 mg | ORAL_CAPSULE | Freq: Every day | ORAL | 0 refills | Status: DC

## 2019-06-28 NOTE — Progress Notes (Signed)
Virtual Visit via Video Note   I connected with Megan Lewis on 06/28/19 by a video enabled telemedicine application and verified that I am speaking with the correct person using two identifiers.  Location patient: [facility] waiting for her son. She is in a private area, where she can talk freely. Location provider:work office Persons participating in the virtual visit: patient, provider  I discussed the limitations of evaluation and management by telemedicine and the availability of in person appointments. The patient expressed understanding and agreed to proceed.   HPI: Megan Lewis is a 38 yo female following on ADD. She was last seen for ADD on 09/14/2018. Currently she is on Adderall XR 30 mg daily during working days. She still feels like she is benefiting from medication. Denies side effects. She can sleep better, 6 to 8 hours. Negative for abnormal wt loss,changes in appetite, tics,depression,tremor, or anxiety.  ROS: See pertinent positives and negatives per HPI.  Past Medical History:  Diagnosis Date  . ADD (attention deficit disorder)   . GERD (gastroesophageal reflux disease)     Past Surgical History:  Procedure Laterality Date  . CYST EXCISION N/A 02/16/2019   Procedure: EXCISION OF SEBACEOUS CYST -SCALP;  Surgeon: Donnie Mesa, MD;  Location: [facility];  Service: General;  Laterality: N/A;    Family History  Problem Relation Age of Onset  . Hyperlipidemia Mother   . Heart disease Mother   . Hypertension Mother   . Hyperlipidemia Father   . Heart disease Father   . Hypertension Father   . Cancer Maternal Grandmother        breast  . Cancer Paternal Grandfather        lung    Social History   Socioeconomic History  . Marital status: Married    Spouse name: Not on file  . Number of children: Not on file  . Years of education: Not on file  . Highest education level: Not on file  Occupational History  . Not on file  Tobacco Use  .  Smoking status: Never Smoker  . Smokeless tobacco: Never Used  Substance and Sexual Activity  . Alcohol use: Yes    Comment: occas  . Drug use: No  . Sexual activity: Yes    Birth control/protection: I.U.D.  Other Topics Concern  . Not on file  Social History Narrative  . Not on file   Social Determinants of Health   Financial Resource Strain:   . Difficulty of Paying Living Expenses:   Food Insecurity:   . Worried About Charity fundraiser in the Last Year:   . Arboriculturist in the Last Year:   Transportation Needs:   . Film/video editor (Medical):   Marland Kitchen Lack of Transportation (Non-Medical):   Physical Activity:   . Days of Exercise per Week:   . Minutes of Exercise per Session:   Stress:   . Feeling of Stress :   Social Connections:   . Frequency of Communication with Friends and Family:   . Frequency of Social Gatherings with Friends and Family:   . Attends Religious Services:   . Active Member of Clubs or Organizations:   . Attends Archivist Meetings:   Marland Kitchen Marital Status:   Intimate Partner Violence:   . Fear of Current or Ex-Partner:   . Emotionally Abused:   Marland Kitchen Physically Abused:   . Sexually Abused:     Current Outpatient Medications:  .  acetaminophen (TYLENOL) 500  MG tablet, Take 1,000 mg by mouth every 6 (six) hours as needed for moderate pain., Disp: , Rfl:  .  levonorgestrel (MIRENA) 20 MCG/24HR IUD, 1 each by Intrauterine route once., Disp: , Rfl:  .  valACYclovir (VALTREX) 500 MG tablet, 4 TABLETS BY MOUTH AT FIRST SIGHT OF OUTBREAK AND 4 TABS 6 HOURS LATER, Disp: , Rfl: 2 .  amphetamine-dextroamphetamine (ADDERALL XR) 30 MG 24 hr capsule, Take 1 capsule (30 mg total) by mouth daily., Disp: 30 capsule, Rfl: 0 .  pantoprazole (PROTONIX) 20 MG tablet, Take 1 tablet (20 mg total) by mouth daily. (Patient not taking: Reported on 06/28/2019), Disp: 90 tablet, Rfl: 1  EXAM:  VITALS per patient if applicable: Ht 5\' 4"  (1.626 m)   BMI 26.19 kg/m    GENERAL: alert, oriented, appears well and in no acute distress  HEENT: atraumatic, conjunctiva clear, no obvious abnormalities on inspection.  NECK: normal movements of the head and neck  LUNGS: on inspection no signs of respiratory distress, breathing rate appears normal, no obvious gross SOB, gasping or wheezing  CV: no obvious cyanosis  PSYCH/NEURO: pleasant and cooperative, no obvious depression or anxiety, speech and thought processing grossly intact  ASSESSMENT AND PLAN:  Discussed the following assessment and plan:  Attention deficit disorder, unspecified hyperactivity presence - Plan: amphetamine-dextroamphetamine (ADDERALL XR) 30 MG 24 hr capsule, DISCONTINUED: amphetamine-dextroamphetamine (ADDERALL XR) 30 MG 24 hr capsule, DISCONTINUED: amphetamine-dextroamphetamine (ADDERALL XR) 30 MG 24 hr capsule  Problem is still well controlled. Tolerating medication well. Stents side effects of medications. No changes in current management. Med contract on 03/23/2018, we will sign another one once she returns to the office for face-to-face visits. [place] controlled substance report reviewed. Last refill on 05/27/2019. Prescriptions x3 will be sent to her pharmacy.  Follow-up in 5 months.   I discussed the assessment and treatment plan with the patient. The patient was provided an opportunity to ask questions and all were answered. The patient agreed with the plan and demonstrated an understanding of the instructions.  Return in about 5 months (around 11/28/2019) for add.    Yuki Purves Martinique, MD

## 2019-06-29 ENCOUNTER — Telehealth: Payer: Self-pay | Admitting: Family Medicine

## 2019-06-29 ENCOUNTER — Other Ambulatory Visit: Payer: Self-pay | Admitting: Family Medicine

## 2019-06-29 MED ORDER — AMPHETAMINE-DEXTROAMPHET ER 30 MG PO CP24: 30.0000 mg | ORAL_CAPSULE | Freq: Every day | ORAL | 0 refills | Status: DC

## 2019-06-29 NOTE — Telephone Encounter (Signed)
[id number] - [place], [address]  Phone: [phone] Fax:  [phone] is out of amphetamine-dextroamphetamine (ADDERALL XR) 30 MG 24 hr capsule. pt would like this sent to   [facility] instead  [address], [place], [place] [address] Phone: [phone]   [phone]

## 2019-06-29 NOTE — Telephone Encounter (Signed)
Prescription sent as requested. I sent 3 Rx's yesterday. Can you please call [facility] to cancel prescription #1 (06/28/19). Thanks, BJ

## 2019-06-29 NOTE — Progress Notes (Signed)
Rx#1 will be cancelled, resent to new pharmacy as requested. Tacia Hindley [place], MD

## 2019-06-29 NOTE — Telephone Encounter (Signed)
[facility] and do not know when they it have medication in. Please send to [facility] instead  [address], [place], [place] [address] Phone: [phone]   [phone]

## 2019-07-01 NOTE — Telephone Encounter (Signed)
Noted. Nothing further needed at this time.  

## 2019-07-27 ENCOUNTER — Other Ambulatory Visit: Payer: Self-pay | Admitting: Family Medicine

## 2019-07-27 NOTE — Telephone Encounter (Signed)
Patient has Rx's at [facility] available for refill.

## 2019-10-25 ENCOUNTER — Other Ambulatory Visit: Payer: Self-pay | Admitting: Family Medicine

## 2019-10-25 NOTE — Telephone Encounter (Signed)
Forwarding to PCP for approval. Next OV is 11/2019

## 2019-10-28 MED ORDER — AMPHETAMINE-DEXTROAMPHET ER 30 MG PO CP24: 30.0000 mg | ORAL_CAPSULE | Freq: Every day | ORAL | 0 refills | Status: DC

## 2019-11-28 ENCOUNTER — Other Ambulatory Visit: Payer: Self-pay

## 2019-11-28 ENCOUNTER — Encounter: Payer: Self-pay | Admitting: Family Medicine

## 2019-11-28 ENCOUNTER — Ambulatory Visit: Payer: 59 | Admitting: Family Medicine

## 2019-11-28 VITALS — BP 120/70 | HR 103 | Resp 12 | Ht 64.0 in | Wt 158.0 lb

## 2019-11-28 DIAGNOSIS — Z79899 Other long term (current) drug therapy: Secondary | ICD-10-CM

## 2019-11-28 DIAGNOSIS — F988 Other specified behavioral and emotional disorders with onset usually occurring in childhood and adolescence: Secondary | ICD-10-CM

## 2019-11-28 MED ORDER — AMPHETAMINE-DEXTROAMPHET ER 30 MG PO CP24: 30.0000 mg | ORAL_CAPSULE | Freq: Every day | ORAL | 0 refills | Status: DC

## 2019-11-28 NOTE — Progress Notes (Signed)
Megan Lewis is a 39 y.o.female, who is here today to follow on ADD.  Currently she is on Adderall XR 30 mg. She is tolerating medication well, no side major effects reported, some dry mouth. She takes medication almost daily. Denies depression or worsening anxiety symptoms. No difficulty sleeping. She has not noted palpitations,tremor,tics,or headaches. Negative for any illicit drug. She has filled medication monthly when appropriate.  Review of Systems  Constitutional: Negative for activity change, appetite change and fatigue.  HENT: Negative for sore throat and trouble swallowing.   Respiratory: Negative for cough, chest tightness, shortness of breath and wheezing.   Gastrointestinal: Negative for abdominal pain, nausea and vomiting.  Musculoskeletal: Negative for gait problem and myalgias.  Neurological: Negative for syncope and weakness.  Psychiatric/Behavioral: Negative for behavioral problems and confusion.   Current Outpatient Medications on File Prior to Visit  Medication Sig Dispense Refill  . acetaminophen (TYLENOL) 500 MG tablet Take 1,000 mg by mouth every 6 (six) hours as needed for moderate pain.    Marland Kitchen levonorgestrel (MIRENA) 20 MCG/24HR IUD 1 each by Intrauterine route once.    . valACYclovir (VALTREX) 500 MG tablet 4 TABLETS BY MOUTH AT FIRST SIGHT OF OUTBREAK AND 4 TABS 6 HOURS LATER  2   No current facility-administered medications on file prior to visit.    Past Medical History:  Diagnosis Date  . ADD (attention deficit disorder)   . GERD (gastroesophageal reflux disease)     Allergies  Allergen Reactions  . Sulfa Antibiotics Anaphylaxis  . Nsaids Other (See Comments)    Severe burning from stomach to back    Social History   Socioeconomic History  . Marital status: Married    Spouse name: Not on file  . Number of children: Not on file  . Years of education: Not on file  . Highest education level: Not on file  Occupational History   . Not on file  Tobacco Use  . Smoking status: Never Smoker  . Smokeless tobacco: Never Used  Vaping Use  . Vaping Use: Never used  Substance and Sexual Activity  . Alcohol use: Yes    Comment: occas  . Drug use: No  . Sexual activity: Yes    Birth control/protection: I.U.D.  Other Topics Concern  . Not on file  Social History Narrative  . Not on file   Social Determinants of Health   Financial Resource Strain:   . Difficulty of Paying Living Expenses: Not on file  Food Insecurity:   . Worried About Charity fundraiser in the Last Year: Not on file  . Ran Out of Food in the Last Year: Not on file  Transportation Needs:   . Lack of Transportation (Medical): Not on file  . Lack of Transportation (Non-Medical): Not on file  Physical Activity:   . Days of Exercise per Week: Not on file  . Minutes of Exercise per Session: Not on file  Stress:   . Feeling of Stress : Not on file  Social Connections:   . Frequency of Communication with Friends and Family: Not on file  . Frequency of Social Gatherings with Friends and Family: Not on file  . Attends Religious Services: Not on file  . Active Member of Clubs or Organizations: Not on file  . Attends Archivist Meetings: Not on file  . Marital Status: Not on file    Vitals:   11/28/19 0940  BP: 120/70  Pulse: (!) 103  Resp: 12  SpO2: 99%   Body mass index is 27.12 kg/m.   Physical Exam Constitutional:      General: She is not in acute distress.    Appearance: She is well-developed and well-groomed.  HENT:     Head: Normocephalic and atraumatic.     Mouth/Throat:     Mouth: Mucous membranes are moist.     Pharynx: Oropharynx is clear.  Eyes:     Conjunctiva/sclera: Conjunctivae normal.  Cardiovascular:     Rate and Rhythm: Regular rhythm. Tachycardia present.     Heart sounds: No murmur heard.      Comments: HR 100/min. Pulmonary:     Effort: Pulmonary effort is normal. No respiratory distress.      Breath sounds: Normal breath sounds.  Skin:    General: Skin is warm.     Findings: No erythema.  Neurological:     Mental Status: She is alert and oriented to person, place, and time.     Gait: Gait normal.  Psychiatric:        Mood and Affect: Mood and affect normal.    Assessment and plan  MeganMartesha was seen today for follow-up.  Diagnoses and all orders for this visit:  Attention deficit disorder, unspecified hyperactivity presence -     Discontinue: amphetamine-dextroamphetamine (ADDERALL XR) 30 MG 24 hr capsule; Take 1 capsule (30 mg total) by mouth daily. -     Discontinue: amphetamine-dextroamphetamine (ADDERALL XR) 30 MG 24 hr capsule; Take 1 capsule (30 mg total) by mouth daily. -     DRUG MONITORING, PANEL 8 WITH CONFIRMATION, URINE; Future -     amphetamine-dextroamphetamine (ADDERALL XR) 30 MG 24 hr capsule; Take 1 capsule (30 mg total) by mouth daily. -     DRUG MONITORING, PANEL 8 WITH CONFIRMATION, URINE  High risk medication use -     DRUG MONITORING, PANEL 8 WITH CONFIRMATION, URINE; Future -     DRUG MONITORING, PANEL 8 WITH CONFIRMATION, URINE  Other orders -     DM TEMPLATE   ADD (attention deficit disorder) Symptoms well controlled, no changes in current management. Medication contract signed today. Urine tox today. [place] controlled subs report reviewed,last refill on 10/31/19. Side effects of medication reviewed. Rx x 3 sent today. F/U in 5-6 months.   Rudolf Blizard [name], MD  [facility]. [place] office.  A few things to remember from today's visit:   Attention deficit disorder, unspecified hyperactivity presence - Plan: DRUG MONITORING, PANEL 8 WITH CONFIRMATION, URINE, amphetamine-dextroamphetamine (ADDERALL XR) 30 MG 24 hr capsule, DISCONTINUED: amphetamine-dextroamphetamine (ADDERALL XR) 30 MG 24 hr capsule, DISCONTINUED: amphetamine-dextroamphetamine (ADDERALL XR) 30 MG 24 hr capsule  High risk medication use - Plan: DRUG MONITORING,  PANEL 8 WITH CONFIRMATION, URINE  If you need refills please call your pharmacy. Do not use My Chart to request refills or for acute issues that need immediate attention.   No changes today. 3 prescriptions were sent to your pharmacy.  Please be sure medication list is accurate. If a new problem present, please set up appointment sooner than planned today.

## 2019-11-28 NOTE — Assessment & Plan Note (Signed)
Symptoms well controlled, no changes in current management. Medication contract signed today. Urine tox today. [place] controlled subs report reviewed,last refill on 10/31/19. Side effects of medication reviewed. Rx x 3 sent today. F/U in 5-6 months.

## 2019-11-28 NOTE — Patient Instructions (Signed)
A few things to remember from today's visit:   Attention deficit disorder, unspecified hyperactivity presence - Plan: DRUG MONITORING, PANEL 8 WITH CONFIRMATION, URINE, amphetamine-dextroamphetamine (ADDERALL XR) 30 MG 24 hr capsule, DISCONTINUED: amphetamine-dextroamphetamine (ADDERALL XR) 30 MG 24 hr capsule, DISCONTINUED: amphetamine-dextroamphetamine (ADDERALL XR) 30 MG 24 hr capsule  High risk medication use - Plan: DRUG MONITORING, PANEL 8 WITH CONFIRMATION, URINE  If you need refills please call your pharmacy. Do not use My Chart to request refills or for acute issues that need immediate attention.   No changes today. 3 prescriptions were sent to your pharmacy.  Please be sure medication list is accurate. If a new problem present, please set up appointment sooner than planned today.

## 2019-11-30 LAB — DRUG MONITORING, PANEL 8 WITH CONFIRMATION, URINE
6 Acetylmorphine: NEGATIVE ng/mL (ref <10)
Alcohol Metabolites: NEGATIVE ng/mL
Amphetamine: 5637 ng/mL — ABNORMAL HIGH (ref <250)
Amphetamines: POSITIVE ng/mL — AB (ref <500)
Benzodiazepines: NEGATIVE ng/mL (ref <100)
Buprenorphine, Urine: NEGATIVE ng/mL (ref <5)
Cocaine Metabolite: NEGATIVE ng/mL (ref <150)
Creatinine: 201.2 mg/dL
Ethyl Glucuronide (ETG): NEGATIVE ng/mL (ref <500)
Ethyl Sulfate (ETS): NEGATIVE ng/mL (ref <100)
MDMA: NEGATIVE ng/mL (ref <500)
Marijuana Metabolite: NEGATIVE ng/mL (ref <20)
Methamphetamine: NEGATIVE ng/mL (ref <250)
Opiates: NEGATIVE ng/mL (ref <100)
Oxidant: NEGATIVE ug/mL
Oxycodone: NEGATIVE ng/mL (ref <100)
pH: 5.7 (ref 4.5–9.0)

## 2019-11-30 LAB — DM TEMPLATE

## 2019-12-01 ENCOUNTER — Encounter: Payer: Self-pay | Admitting: Family Medicine

## 2020-03-02 ENCOUNTER — Other Ambulatory Visit: Payer: Self-pay | Admitting: Family Medicine

## 2020-03-02 DIAGNOSIS — F988 Other specified behavioral and emotional disorders with onset usually occurring in childhood and adolescence: Secondary | ICD-10-CM

## 2020-03-08 MED ORDER — AMPHETAMINE-DEXTROAMPHET ER 30 MG PO CP24: 30.0000 mg | ORAL_CAPSULE | Freq: Every day | ORAL | 0 refills | Status: DC

## 2020-03-08 NOTE — Telephone Encounter (Signed)
Last office visit- 11/28/2019 Last refill- 11/28/2019--30 tabs with no refill

## 2020-04-11 ENCOUNTER — Other Ambulatory Visit: Payer: Self-pay | Admitting: Family Medicine

## 2020-04-11 DIAGNOSIS — F988 Other specified behavioral and emotional disorders with onset usually occurring in childhood and adolescence: Secondary | ICD-10-CM

## 2020-04-11 NOTE — Telephone Encounter (Signed)
Last filled 03/09/20

## 2020-04-13 MED ORDER — AMPHETAMINE-DEXTROAMPHET ER 30 MG PO CP24: 30.0000 mg | ORAL_CAPSULE | Freq: Every day | ORAL | 0 refills | Status: DC

## 2020-05-14 ENCOUNTER — Other Ambulatory Visit: Payer: Self-pay | Admitting: Family Medicine

## 2020-05-14 DIAGNOSIS — F988 Other specified behavioral and emotional disorders with onset usually occurring in childhood and adolescence: Secondary | ICD-10-CM

## 2020-05-14 MED ORDER — AMPHETAMINE-DEXTROAMPHET ER 30 MG PO CP24: 30.0000 mg | ORAL_CAPSULE | Freq: Every day | ORAL | 0 refills | Status: DC

## 2020-06-14 ENCOUNTER — Other Ambulatory Visit: Payer: Self-pay | Admitting: Family Medicine

## 2020-06-14 DIAGNOSIS — F988 Other specified behavioral and emotional disorders with onset usually occurring in childhood and adolescence: Secondary | ICD-10-CM

## 2020-06-18 ENCOUNTER — Other Ambulatory Visit: Payer: Self-pay

## 2020-06-19 ENCOUNTER — Ambulatory Visit: Payer: 59 | Admitting: Family Medicine

## 2020-06-19 VITALS — BP 126/78 | HR 90 | Temp 99.0°F | Resp 12 | Ht 64.0 in | Wt 165.0 lb

## 2020-06-19 DIAGNOSIS — F988 Other specified behavioral and emotional disorders with onset usually occurring in childhood and adolescence: Secondary | ICD-10-CM

## 2020-06-19 DIAGNOSIS — J040 Acute laryngitis: Secondary | ICD-10-CM

## 2020-06-19 MED ORDER — AMPHETAMINE-DEXTROAMPHET ER 30 MG PO CP24: 30.0000 mg | ORAL_CAPSULE | Freq: Every day | ORAL | 0 refills | Status: DC

## 2020-06-19 NOTE — Progress Notes (Signed)
Chief Complaint  Patient presents with  . Medication Refill    [name] is a 40 y.o.female, who is here today to follow on ADD. Last follow up 11/28/19. Currently she is on Adderal XR 30 mg. She is tolerating medication well, no side effects reported. She takes medication from Monday through Friday mainly. Last refill > 30 days ago.  Negative for changes in appetite,wt loss,palpitations,tremor,tics, depression or worsening anxiety symptoms. Sleeping about 7-8 hours.  She is also c/o dysphonia for the past few days. No sick contact or recent travel. She has been taking care of her mother In law, she thinks symptoms were trigger by environmental changes.  She has not had fever,chills,sore throat,cough,SOB,wheezing,or stridor. Negative for anosmia and ageusia.  Seasonal allergies, she started flonase nasal spray.  Review of Systems  Constitutional: Negative for activity change, appetite change and fatigue.  Cardiovascular: Negative for chest pain and leg swelling.  Gastrointestinal: Negative for abdominal pain, nausea and vomiting.       Negative for changes in bowel habits.  Musculoskeletal: Negative for gait problem and myalgias.  Skin: Negative for rash.  Allergic/Immunologic: Positive for environmental allergies.  Neurological: Negative for tremors and headaches.  Psychiatric/Behavioral: Negative for suicidal ideas.   Current Outpatient Medications on File Prior to Visit  Medication Sig Dispense Refill  . acetaminophen (TYLENOL) 500 MG tablet Take 1,000 mg by mouth every 6 (six) hours as needed for moderate pain.    Marland Kitchen levonorgestrel (MIRENA) 20 MCG/24HR IUD 1 each by Intrauterine route once.    . valACYclovir (VALTREX) 500 MG tablet 4 TABLETS BY MOUTH AT FIRST SIGHT OF OUTBREAK AND 4 TABS 6 HOURS LATER  2   No current facility-administered medications on file prior to visit.     Past Medical History:  Diagnosis Date  . ADD (attention deficit disorder)    . GERD (gastroesophageal reflux disease)     Allergies  Allergen Reactions  . Sulfa Antibiotics Anaphylaxis  . Nsaids Other (See Comments)    Severe burning from stomach to back    Social History   Socioeconomic History  . Marital status: Married    Spouse name: Not on file  . Number of children: Not on file  . Years of education: Not on file  . Highest education level: Not on file  Occupational History  . Not on file  Tobacco Use  . Smoking status: Never Smoker  . Smokeless tobacco: Never Used  Vaping Use  . Vaping Use: Never used  Substance and Sexual Activity  . Alcohol use: Yes    Comment: occas  . Drug use: No  . Sexual activity: Yes    Birth control/protection: I.U.D.  Other Topics Concern  . Not on file  Social History Narrative  . Not on file   Social Determinants of Health   Financial Resource Strain: Not on file  Food Insecurity: Not on file  Transportation Needs: Not on file  Physical Activity: Not on file  Stress: Not on file  Social Connections: Not on file    Vitals:   06/19/20 1219  BP: 126/78  Pulse: 90  Resp: 12  Temp: 99 F (37.2 C)  SpO2: 98%   Body mass index is 28.32 kg/m. Physical Exam Vitals and nursing note reviewed.  Constitutional:      General: She is not in acute distress.    Appearance: She is well-developed.  HENT:     Head: Normocephalic and atraumatic.     Mouth/Throat:  Mouth: Mucous membranes are moist.     Pharynx: Oropharynx is clear.     Comments: Mild dysphonia. Eyes:     Conjunctiva/sclera: Conjunctivae normal.  Cardiovascular:     Rate and Rhythm: Normal rate and regular rhythm.     Heart sounds: No murmur heard.   Pulmonary:     Effort: Pulmonary effort is normal. No respiratory distress.     Breath sounds: Normal breath sounds.  Skin:    General: Skin is warm.     Findings: No erythema.  Neurological:     Mental Status: She is alert and oriented to person, place, and time.     Gait: Gait  normal.   Assessment and plan  [name] was seen today for medication refill.  Diagnoses and all orders for this visit:  Acute laryngitis We discussed possible etiologies. Viral vs allergic. Symptomatic treatment recommended for now. Voice rest. Instructed about warning signs.  Attention deficit disorder, unspecified hyperactivity presence Symptoms well controlled. Continue Adderall XR 30 mg daily prn, Rx x 3 sent. We discussed current guideline in regard to chronic management with controlled medications. PDMP reviewed.  Medication contract is current. Side effects of medication reviewed. F/U in 5 months.  -     Discontinue: amphetamine-dextroamphetamine (ADDERALL XR) 30 MG 24 hr capsule; Take 1 capsule (30 mg total) by mouth daily. -     Discontinue: amphetamine-dextroamphetamine (ADDERALL XR) 30 MG 24 hr capsule; Take 1 capsule (30 mg total) by mouth daily. -     amphetamine-dextroamphetamine (ADDERALL XR) 30 MG 24 hr capsule; Take 1 capsule (30 mg total) by mouth daily.  Return in about 5 months (around 11/19/2020) for ADD.   [name] [name], MD  [facility]. [place] office.   A few things to remember from today's visit:   Acute laryngitis  Attention deficit disorder, unspecified hyperactivity presence - Plan: amphetamine-dextroamphetamine (ADDERALL XR) 30 MG 24 hr capsule  If you need refills please call your pharmacy. Do not use My Chart to request refills or for acute issues that need immediate attention.   No changes today. Voice rest. Zyrtec 10 mg daily and continue Flonase nasal spray.  Hoarseness  Hoarseness, also called dysphonia, is any abnormal change in your voice that can make it difficult to speak. Your voice may sound raspy, breathy, or strained. Hoarseness is caused by a problem with your vocal cords (vocal folds). These are two bands of tissue inside your voice box (larynx). When you speak, your vocal cords move back and forth to  create sound. The surfaces of your vocal cords need to be smooth for your voice to sound clear. Swelling or lumps on your vocal cords can cause hoarseness. Common causes of vocal cord problems include:  Infection in the nose, throat, and upper air passages (upper respiratory infection).  A long-term cough.  Straining or overusing your voice.  Smoking, or exposure to secondhand smoke.  Allergies.  Medication side effects.  Vocal cord growths.  Vocal cord injuries.  Stomach acids that move up in your throat and irritate your vocal cords (gastroesophageal reflux).  Diseases that affect the nervous system, such as a stroke or Parkinson's disease. Follow these instructions at home: Watch your condition for any changes. To ease discomfort and protect your vocal cords:  Rest your voice.  Do not whisper. Whispering can cause muscle strain.  Do not speak in a loud or harsh voice.  Avoid coughing or clearing your throat.  Do not use any products that  contain nicotine or tobacco, such as cigarettes and e-cigarettes. If you need help quitting, ask your health care provider.  Avoid secondhand smoke.  Do not eat foods that give you heartburn, such as spicy or acidic foods like hot peppers and orange juice. Heartburn can make gastroesophageal reflux worse.  Do not drink beverages that contain caffeine (coffee, tea, or soft drinks) or alcohol (beer, wine, or liquor).  Drink enough fluid to keep your urine pale yellow.  Use a humidifier if the air in your home is dry. If recommended by your health care provider, schedule an appointment with a speech-language specialist. This specialist may give you methods to try that can help you avoid misusing your voice.   Contact a health care provider if:  You have hoarseness that lasts longer than 3 weeks.  You almost lose or completely lose your voice for more than 3 days.  You have pain when you swallow or try to talk.  You feel a lump in  your neck. Get help right away if:  You have trouble swallowing.  You feel like you are choking when you swallow.  You cough up blood or vomit blood.  You have trouble breathing.  You choke, cannot swallow, or cannot breathe if you lie flat.  You notice swelling or a rash on your body, face, or tongue. Summary  Hoarseness, also called dysphonia, is any abnormal change in your voice that can make it difficult to speak. Your voice may sound raspy, breathy, or strained.  Hoarseness is caused by a problem with your vocal cords (vocal folds).  Do not speak in a loud or harsh voice, use nicotine or tobacco products, or eat foods that give you heartburn.  If recommended by your health care provider, meet with a speech-language specialist. This information is not intended to replace advice given to you by your health care provider. Make sure you discuss any questions you have with your health care provider. Document Revised: 01/30/2017 Document Reviewed: 11/14/2016 Elsevier Patient Education  2021 [place].  Please be sure medication list is accurate. If a new problem present, please set up appointment sooner than planned today.

## 2020-06-19 NOTE — Patient Instructions (Signed)
A few things to remember from today's visit:   Acute laryngitis  Attention deficit disorder, unspecified hyperactivity presence - Plan: amphetamine-dextroamphetamine (ADDERALL XR) 30 MG 24 hr capsule  If you need refills please call your pharmacy. Do not use My Chart to request refills or for acute issues that need immediate attention.   No changes today. Voice rest. Zyrtec 10 mg daily and continue Flonase nasal spray.  Hoarseness  Hoarseness, also called dysphonia, is any abnormal change in your voice that can make it difficult to speak. Your voice may sound raspy, breathy, or strained. Hoarseness is caused by a problem with your vocal cords (vocal folds). These are two bands of tissue inside your voice box (larynx). When you speak, your vocal cords move back and forth to create sound. The surfaces of your vocal cords need to be smooth for your voice to sound clear. Swelling or lumps on your vocal cords can cause hoarseness. Common causes of vocal cord problems include:  Infection in the nose, throat, and upper air passages (upper respiratory infection).  A long-term cough.  Straining or overusing your voice.  Smoking, or exposure to secondhand smoke.  Allergies.  Medication side effects.  Vocal cord growths.  Vocal cord injuries.  Stomach acids that move up in your throat and irritate your vocal cords (gastroesophageal reflux).  Diseases that affect the nervous system, such as a stroke or Parkinson's disease. Follow these instructions at home: Watch your condition for any changes. To ease discomfort and protect your vocal cords:  Rest your voice.  Do not whisper. Whispering can cause muscle strain.  Do not speak in a loud or harsh voice.  Avoid coughing or clearing your throat.  Do not use any products that contain nicotine or tobacco, such as cigarettes and e-cigarettes. If you need help quitting, ask your health care provider.  Avoid secondhand smoke.  Do not  eat foods that give you heartburn, such as spicy or acidic foods like hot peppers and orange juice. Heartburn can make gastroesophageal reflux worse.  Do not drink beverages that contain caffeine (coffee, tea, or soft drinks) or alcohol (beer, wine, or liquor).  Drink enough fluid to keep your urine pale yellow.  Use a humidifier if the air in your home is dry. If recommended by your health care provider, schedule an appointment with a speech-language specialist. This specialist may give you methods to try that can help you avoid misusing your voice.   Contact a health care provider if:  You have hoarseness that lasts longer than 3 weeks.  You almost lose or completely lose your voice for more than 3 days.  You have pain when you swallow or try to talk.  You feel a lump in your neck. Get help right away if:  You have trouble swallowing.  You feel like you are choking when you swallow.  You cough up blood or vomit blood.  You have trouble breathing.  You choke, cannot swallow, or cannot breathe if you lie flat.  You notice swelling or a rash on your body, face, or tongue. Summary  Hoarseness, also called dysphonia, is any abnormal change in your voice that can make it difficult to speak. Your voice may sound raspy, breathy, or strained.  Hoarseness is caused by a problem with your vocal cords (vocal folds).  Do not speak in a loud or harsh voice, use nicotine or tobacco products, or eat foods that give you heartburn.  If recommended by your health care provider,  meet with a speech-language specialist. This information is not intended to replace advice given to you by your health care provider. Make sure you discuss any questions you have with your health care provider. Document Revised: 01/30/2017 Document Reviewed: 11/14/2016 Elsevier Patient Education  2021 [place].  Please be sure medication list is accurate. If a new problem present, please set up appointment  sooner than planned today.

## 2020-06-21 ENCOUNTER — Encounter: Payer: Self-pay | Admitting: Family Medicine

## 2020-06-21 MED ORDER — AMPHETAMINE-DEXTROAMPHET ER 30 MG PO CP24: 30.0000 mg | ORAL_CAPSULE | Freq: Every day | ORAL | 0 refills | Status: DC

## 2020-06-21 MED ORDER — AMPHETAMINE-DEXTROAMPHET ER 30 MG PO CP24
30.0000 mg | ORAL_CAPSULE | Freq: Every day | ORAL | 0 refills | Status: DC
Start: 2020-06-21 — End: 2020-09-21

## 2020-09-21 ENCOUNTER — Other Ambulatory Visit: Payer: Self-pay | Admitting: Family Medicine

## 2020-09-21 DIAGNOSIS — F988 Other specified behavioral and emotional disorders with onset usually occurring in childhood and adolescence: Secondary | ICD-10-CM

## 2020-09-24 NOTE — Telephone Encounter (Signed)
Last filled: 08/21/20 Last OV: 06/19/20 Next OV: F/U due 11/2020

## 2020-09-25 ENCOUNTER — Other Ambulatory Visit: Payer: Self-pay | Admitting: Family Medicine

## 2020-09-25 DIAGNOSIS — F988 Other specified behavioral and emotional disorders with onset usually occurring in childhood and adolescence: Secondary | ICD-10-CM

## 2020-09-25 MED ORDER — AMPHETAMINE-DEXTROAMPHET ER 30 MG PO CP24: 30.0000 mg | ORAL_CAPSULE | Freq: Every day | ORAL | 0 refills | Status: DC

## 2020-10-23 ENCOUNTER — Other Ambulatory Visit: Payer: Self-pay | Admitting: Family Medicine

## 2020-10-23 DIAGNOSIS — F988 Other specified behavioral and emotional disorders with onset usually occurring in childhood and adolescence: Secondary | ICD-10-CM

## 2020-10-23 NOTE — Telephone Encounter (Signed)
Can you deny this Rx? She has one on file at the pharmacy.

## 2020-11-20 ENCOUNTER — Other Ambulatory Visit: Payer: Self-pay | Admitting: Family Medicine

## 2020-11-20 DIAGNOSIS — F988 Other specified behavioral and emotional disorders with onset usually occurring in childhood and adolescence: Secondary | ICD-10-CM

## 2020-11-20 NOTE — Telephone Encounter (Signed)
Last filled 10/25/20

## 2020-11-22 ENCOUNTER — Telehealth: Payer: Self-pay | Admitting: Family Medicine

## 2020-11-22 ENCOUNTER — Other Ambulatory Visit: Payer: Self-pay | Admitting: Family Medicine

## 2020-11-22 DIAGNOSIS — F988 Other specified behavioral and emotional disorders with onset usually occurring in childhood and adolescence: Secondary | ICD-10-CM

## 2020-11-22 NOTE — Telephone Encounter (Signed)
PT called to inquire about why her amphetamine-dextroamphetamine (ADDERALL XR) 30 MG 24 hr capsule was denied as she needs this refill asap as her last pill is tomorrow and then she will be out. She states that being off of it causes her to feel slower then normal and would like to get this taken care of asap. Please advise.

## 2020-11-22 NOTE — Telephone Encounter (Signed)
Patient called because she requested a callback to speak about why her prescription was denied. Prescription is for  amphetamine-dextroamphetamine (ADDERALL XR) 30 MG 24 hr capsule        Good callback number is [phone]    Please Advise

## 2020-11-23 ENCOUNTER — Encounter: Payer: Self-pay | Admitting: Internal Medicine

## 2020-11-23 ENCOUNTER — Telehealth: Payer: 59 | Admitting: Internal Medicine

## 2020-11-23 ENCOUNTER — Other Ambulatory Visit: Payer: Self-pay | Admitting: *Deleted

## 2020-11-23 DIAGNOSIS — F988 Other specified behavioral and emotional disorders with onset usually occurring in childhood and adolescence: Secondary | ICD-10-CM

## 2020-11-23 MED ORDER — AMPHETAMINE-DEXTROAMPHET ER 30 MG PO CP24: 30.0000 mg | ORAL_CAPSULE | Freq: Every day | ORAL | 0 refills | Status: DC

## 2020-11-23 NOTE — Telephone Encounter (Signed)
Refill already sent by Dr Jerilee Hoh.

## 2020-11-23 NOTE — Telephone Encounter (Signed)
Needs appointment, can be virtual. Once scheduled - I will see if another provider can refill.

## 2020-12-24 ENCOUNTER — Other Ambulatory Visit: Payer: Self-pay | Admitting: Internal Medicine

## 2020-12-24 ENCOUNTER — Encounter: Payer: Self-pay | Admitting: Family Medicine

## 2020-12-24 ENCOUNTER — Telehealth (INDEPENDENT_AMBULATORY_CARE_PROVIDER_SITE_OTHER): Payer: 59 | Admitting: Family Medicine

## 2020-12-24 VITALS — Ht 64.0 in

## 2020-12-24 DIAGNOSIS — F988 Other specified behavioral and emotional disorders with onset usually occurring in childhood and adolescence: Secondary | ICD-10-CM | POA: Diagnosis not present

## 2020-12-24 MED ORDER — AMPHETAMINE-DEXTROAMPHET ER 30 MG PO CP24: 30.0000 mg | ORAL_CAPSULE | ORAL | 0 refills | Status: DC

## 2020-12-24 MED ORDER — AMPHETAMINE-DEXTROAMPHET ER 30 MG PO CP24: 30.0000 mg | ORAL_CAPSULE | Freq: Every day | ORAL | 0 refills | Status: DC

## 2020-12-24 NOTE — Progress Notes (Addendum)
Virtual Visit via Video Note I connected with Megan Lewis on 12/24/20 by a video enabled telemedicine application and verified that I am speaking with the correct person using two identifiers.  Location patient: In her car, not driving. Location provider:work office Persons participating in the virtual visit: patient, provider  I discussed the limitations of evaluation and management by telemedicine and the availability of in person appointments. The patient expressed understanding and agreed to proceed.  Chief Complaint  Patient presents with   ADHD   HPI: Megan Lewis is a 40 yo female with hx of GERD,IBS,and ADD being seen today for 5-6 months follow up. She was last seen on 06/19/20. No new problem since her last visit.  ADD Dx'ed around 2015. Before medication was started she was misplacing papers, had issues with organization,and not able to complete tasks on time. Patient is currently taking Adderall XR 30 mg daily. She is having no side effects and tolerates the medication well. The medication is still continuing to work for her.  Denies abnormal wt loss, CP,palpitations, abdominal pain,N/V,headache,tremor,or tics. She is sleeping well, about 8 hours.  Medication last refilled on 11/24/20.  ROS: See pertinent positives and negatives per HPI.  Past Medical History:  Diagnosis Date   ADD (attention deficit disorder)    GERD (gastroesophageal reflux disease)     Past Surgical History:  Procedure Laterality Date   CYST EXCISION N/A 02/16/2019   Procedure: EXCISION OF SEBACEOUS CYST -SCALP;  Surgeon: Donnie Mesa, MD;  Location: [place];  Service: General;  Laterality: N/A;    Family History  Problem Relation Age of Onset   Hyperlipidemia Mother    Heart disease Mother    Hypertension Mother    Hyperlipidemia Father    Heart disease Father    Hypertension Father    Cancer Maternal Grandmother        breast   Cancer Paternal Grandfather         lung    Social History   Socioeconomic History   Marital status: Married    Spouse name: Not on file   Number of children: Not on file   Years of education: Not on file   Highest education level: Not on file  Occupational History   Not on file  Tobacco Use   Smoking status: Never   Smokeless tobacco: Never  Vaping Use   Vaping Use: Never used  Substance and Sexual Activity   Alcohol use: Yes    Comment: occas   Drug use: No   Sexual activity: Yes    Birth control/protection: I.U.D.  Other Topics Concern   Not on file  Social History Narrative   Not on file   Social Determinants of Health   Financial Resource Strain: Not on file  Food Insecurity: Not on file  Transportation Needs: Not on file  Physical Activity: Not on file  Stress: Not on file  Social Connections: Not on file  Intimate Partner Violence: Not on file    Current Outpatient Medications:    acetaminophen (TYLENOL) 500 MG tablet, Take 1,000 mg by mouth every 6 (six) hours as needed for moderate pain., Disp: , Rfl:    [START ON 01/24/2021] amphetamine-dextroamphetamine (ADDERALL XR) 30 MG 24 hr capsule, Take 1 capsule (30 mg total) by mouth every morning., Disp: 30 capsule, Rfl: 0   [START ON 02/23/2021] amphetamine-dextroamphetamine (ADDERALL XR) 30 MG 24 hr capsule, Take 1 capsule (30 mg total) by mouth every morning., Disp: 30 capsule,  Rfl: 0   levonorgestrel (MIRENA) 20 MCG/24HR IUD, 1 each by Intrauterine route once., Disp: , Rfl:    valACYclovir (VALTREX) 500 MG tablet, 4 TABLETS BY MOUTH AT FIRST SIGHT OF OUTBREAK AND 4 TABS 6 HOURS LATER, Disp: , Rfl: 2   amphetamine-dextroamphetamine (ADDERALL XR) 30 MG 24 hr capsule, Take 1 capsule (30 mg total) by mouth daily., Disp: 30 capsule, Rfl: 0  EXAM:  VITALS per patient if applicable:Ht 5\' 4"  (1.626 m)   BMI 27.46 kg/m   GENERAL: alert, oriented, appears well and in no acute distress  HEENT: atraumatic, conjunctiva clear, no obvious  abnormalities on inspection of external nose and ears  LUNGS: on inspection no signs of respiratory distress, breathing rate appears normal, no obvious gross SOB, gasping or wheezing  CV: no obvious cyanosis  PSYCH/NEURO: pleasant and cooperative, no obvious depression or anxiety, speech and thought processing grossly intact  ASSESSMENT AND PLAN:  Discussed the following assessment and plan:  Attention deficit disorder, unspecified hyperactivity presence - Plan: amphetamine-dextroamphetamine (ADDERALL XR) 30 MG 24 hr capsule  ADD (attention deficit disorder) Problem is well controlled. Continue Adderall XR 30 mg daily. We discussed some side effects. Last med contract 11/2019, will plan on signing a new one next visit. UTS 11/2019. We discussed current recommendations in regard to controlled medications and the importance of following as instructed. [place] controlled substance report reviewed. Adderall Rx sent x 3. F/U in 5-6 months, before if needed.  I discussed the assessment and treatment plan with the patient. The patient was provided an opportunity to ask questions and all were answered. The patient agreed with the plan and demonstrated an understanding of the instructions.  Return in about 5 months (around 05/24/2021) for ADD.  Betty Martinique, MD

## 2020-12-24 NOTE — Assessment & Plan Note (Signed)
Problem is well controlled. Continue Adderall XR 30 mg daily. We discussed some side effects. Last med contract 11/2019, will plan on signing a new one next visit. UTS 11/2019. We discussed current recommendations in regard to controlled medications and the importance of following as instructed. [place] controlled substance report reviewed. Adderall Rx sent x 3. F/U in 5-6 months, before if needed.

## 2021-03-27 ENCOUNTER — Other Ambulatory Visit: Payer: Self-pay | Admitting: Family Medicine

## 2021-03-27 ENCOUNTER — Telehealth: Payer: Self-pay | Admitting: Family Medicine

## 2021-03-27 DIAGNOSIS — F988 Other specified behavioral and emotional disorders with onset usually occurring in childhood and adolescence: Secondary | ICD-10-CM

## 2021-03-27 MED ORDER — AMPHETAMINE-DEXTROAMPHET ER 30 MG PO CP24: 30.0000 mg | ORAL_CAPSULE | Freq: Every day | ORAL | 0 refills | Status: DC

## 2021-03-27 NOTE — Telephone Encounter (Signed)
See my chart encounter.

## 2021-03-27 NOTE — Telephone Encounter (Deleted)
Patient wants to discuss with Dr.Jordan on what else she could do since Adderall was denied. She is also requesting a reason on why the medication was denied.  Patient could be contacted at [phone].  Please advise.

## 2021-03-27 NOTE — Addendum Note (Signed)
Addended by: Rodrigo Ran on: 03/27/2021 02:29 PM   Modules accepted: Orders

## 2021-03-27 NOTE — Telephone Encounter (Signed)
Patient is requesting a phone call from someone regarding another option for adderall since it was denied. Patient is also requesting an answer on why medication was denied.  Patient could be contacted at [phone].  Please advise.

## 2021-04-24 ENCOUNTER — Other Ambulatory Visit: Payer: Self-pay | Admitting: Family Medicine

## 2021-04-24 DIAGNOSIS — F988 Other specified behavioral and emotional disorders with onset usually occurring in childhood and adolescence: Secondary | ICD-10-CM

## 2021-04-24 MED ORDER — AMPHETAMINE-DEXTROAMPHET ER 30 MG PO CP24: 30.0000 mg | ORAL_CAPSULE | Freq: Every day | ORAL | 0 refills | Status: DC

## 2021-04-24 NOTE — Telephone Encounter (Signed)
-   last filled 03/26/21 - pharmacy does not have rx on file.

## 2021-05-28 NOTE — Progress Notes (Signed)
? ?Megan Lewis is a 41 y.o. female here today to follow on ADD. ? ?Last f/u visit: 12/24/20 ? ?Currently she is on Adderall XR 30 mg daily. ?Dx'ed as an adult around 2015. ?She is tolerating medication well, no side effects reported. ?She has had problems with her pharmacy filling Rx on time, so she is considering changing to a different one. ? ?She takes medication almost daily. ?Medication is helping with concentration and focusing at work, able to complete tasks in timely matter without being easily distracted. ?Negative for headaches,SOB,palpitations,abdominal pain,tremors,or ticks. ?Denies depression or anxiety symptoms. ?No difficulty sleeping. ? ?Negative for illicit drug use and keeps medication in a safe place. ? ?Review of Systems  ?Constitutional: Negative for activity change, appetite change, fatigue and unexpected weight change.  ?Respiratory: Negative for cough and wheezing.   ?Cardiovascular: Negative for chest pain and leg swelling.  ?Gastrointestinal: Negative for changes in bowel habits,nausea and vomiting.  ?Neurological: Negative for numbness,weakness,and headaches.  ?Psychiatric/Behavioral: Negative for behavioral problems and confusion. The patient is not nervous/anxious.   ? ?Active Ambulatory Problems  ?  Diagnosis Date Noted  ? ADD (attention deficit disorder) 02/04/2013  ? GERD (gastroesophageal reflux disease) 04/25/2016  ? IBS (irritable bowel syndrome) 04/25/2016  ? Anxiety disorder, unspecified 02/13/2017  ? ?Resolved Ambulatory Problems  ?  Diagnosis Date Noted  ? No Resolved Ambulatory Problems  ? ?No Additional Past Medical History  ? ?Current Outpatient Medications on File Prior to Visit  ?Medication Sig Dispense Refill  ? acetaminophen (TYLENOL) 500 MG tablet Take 1,000 mg by mouth every 6 (six) hours as needed for moderate pain.    ? levonorgestrel (MIRENA) 20 MCG/24HR IUD 1 each by Intrauterine route once.    ? valACYclovir (VALTREX) 500 MG tablet 4 TABLETS BY MOUTH AT  FIRST SIGHT OF OUTBREAK AND 4 TABS 6 HOURS LATER  2  ? ?No current facility-administered medications on file prior to visit.  ? ? ?Allergies  ?Allergen Reactions  ? Sulfa Antibiotics Anaphylaxis  ? Nsaids Other (See Comments)  ?  Severe burning from stomach to back  ? ?Social History  ? ?Socioeconomic History  ? Marital status: Married  ?  Spouse name: Not on file  ? Number of children: Not on file  ? Years of education: Not on file  ? Highest education level: Not on file  ?Occupational History  ? Not on file  ?Tobacco Use  ? Smoking status: Never  ? Smokeless tobacco: Never  ?Vaping Use  ? Vaping Use: Never used  ?Substance and Sexual Activity  ? Alcohol use: Yes  ?  Comment: occas  ? Drug use: No  ? Sexual activity: Yes  ?  Birth control/protection: I.U.D.  ?Other Topics Concern  ? Not on file  ?Social History Narrative  ? Not on file  ? ?Social Determinants of Health  ? ?Financial Resource Strain: Not on file  ?Food Insecurity: Not on file  ?Transportation Needs: Not on file  ?Physical Activity: Not on file  ?Stress: Not on file  ?Social Connections: Not on file  ?Intimate Partner Violence: Not on file  ? ?Vitals: ?BP 120/70   Pulse 100   Resp 16   Ht '5\' 4"'$  (1.626 m)   Wt 162 lb 8 oz (73.7 kg)   SpO2 98%   BMI 27.89 kg/m?  ? ?Physical Exam  ?Nursing note and vitals reviewed. ?Constitutional: She is oriented to person, place, and time. She appears well-developed. No distress.  ?HENT:  ?Head: Normocephalic  and atraumatic.  ?Mouth/Throat: Oropharynx is clear and moist and mucous membranes are normal.  ?Eyes: Pupils are equal, conjunctivae are normal.  ?Cardiovascular: Normal rate and regular rhythm.  ?No murmur heard. ?Respiratory: Effort normal and breath sounds normal. No respiratory distress.  ?Musculoskeletal:     ?   General: No edema.  ?Neurological: She is alert and oriented to person, place, and time. She has normal strength. Gait normal.  ?Skin: Skin is warm. No erythema.  ?Psychiatric: She has a  normal mood and affect.  ?Well groomed,good eye contact.  ? ?ASSESSMENT AND PLAN: ? ?[name] was seen today for follow-up. ? ?Diagnoses and all orders for this visit: ?Orders Placed This Encounter  ?Procedures  ? DRUG MONITORING, PANEL 8 WITH CONFIRMATION, URINE  ? DM TEMPLATE  ? ? ?Attention deficit disorder, unspecified hyperactivity presence ?Problem is well controlled. ?Continue Adderall XR 30 mg daily. ?Some side effects discussed. ?Will continue with monthly refills. ?PMP reviewed. ?Medication contract renewed. ?Urine tox ordered. ? ?-     amphetamine-dextroamphetamine (ADDERALL XR) 30 MG 24 hr capsule; Take 1 capsule (30 mg total) by mouth daily. ? ?High risk medication use ?-     DRUG MONITORING, PANEL 8 WITH CONFIRMATION, URINE ? ?Return in about 5 months (around 10/29/2021). ? ?Corrie Reder [name], MD ? ?[place]. ?[place] office. ? ?

## 2021-05-29 ENCOUNTER — Encounter: Payer: Self-pay | Admitting: Family Medicine

## 2021-05-29 ENCOUNTER — Ambulatory Visit: Payer: 59 | Admitting: Family Medicine

## 2021-05-29 VITALS — BP 120/70 | HR 100 | Resp 16 | Ht 64.0 in | Wt 162.5 lb

## 2021-05-29 DIAGNOSIS — Z79899 Other long term (current) drug therapy: Secondary | ICD-10-CM | POA: Diagnosis not present

## 2021-05-29 DIAGNOSIS — F988 Other specified behavioral and emotional disorders with onset usually occurring in childhood and adolescence: Secondary | ICD-10-CM | POA: Diagnosis not present

## 2021-05-29 MED ORDER — AMPHETAMINE-DEXTROAMPHET ER 30 MG PO CP24: 30.0000 mg | ORAL_CAPSULE | Freq: Every day | ORAL | 0 refills | Status: DC

## 2021-05-29 NOTE — Patient Instructions (Addendum)
A few things to remember from today's visit: ? ?Attention deficit disorder, unspecified hyperactivity presence - Plan: DRUG MONITORING, PANEL 8 WITH CONFIRMATION, URINE, amphetamine-dextroamphetamine (ADDERALL XR) 30 MG 24 hr capsule ? ?High risk medication use - Plan: DRUG MONITORING, PANEL 8 WITH CONFIRMATION, URINE ? ?If you need refills please call your pharmacy. ?Do not use My Chart to request refills or for acute issues that need immediate attention. ?  ?Please be sure medication list is accurate. ?If a new problem present, please set up appointment sooner than planned today. ? ?No changes today. ?Will continue monthly refills. ?Prescription sent to new pharmacy. ? ? ? ? ? ? ?

## 2021-06-01 LAB — DRUG MONITORING, PANEL 8 WITH CONFIRMATION, URINE
6 Acetylmorphine: NEGATIVE ng/mL (ref <10)
Alcohol Metabolites: NEGATIVE ng/mL (ref <500)
Amphetamine: 11207 ng/mL — ABNORMAL HIGH (ref <250)
Amphetamines: POSITIVE ng/mL — AB (ref <500)
Benzodiazepines: NEGATIVE ng/mL (ref <100)
Buprenorphine, Urine: NEGATIVE ng/mL (ref <5)
Cocaine Metabolite: NEGATIVE ng/mL (ref <150)
Creatinine: 216.1 mg/dL (ref >=20.0)
MDMA: NEGATIVE ng/mL (ref <500)
Marijuana Metabolite: NEGATIVE ng/mL (ref <20)
Methamphetamine: NEGATIVE ng/mL (ref <250)
Opiates: NEGATIVE ng/mL (ref <100)
Oxidant: NEGATIVE ug/mL (ref <200)
Oxycodone: NEGATIVE ng/mL (ref <100)
pH: 6.1 (ref 4.5–9.0)

## 2021-06-01 LAB — DM TEMPLATE

## 2021-06-03 ENCOUNTER — Ambulatory Visit: Payer: 59 | Admitting: Family Medicine

## 2021-06-26 ENCOUNTER — Other Ambulatory Visit: Payer: Self-pay | Admitting: Family Medicine

## 2021-06-26 DIAGNOSIS — F988 Other specified behavioral and emotional disorders with onset usually occurring in childhood and adolescence: Secondary | ICD-10-CM

## 2021-06-26 NOTE — Telephone Encounter (Signed)
Last filled 05/30/21 ?

## 2021-06-28 MED ORDER — AMPHETAMINE-DEXTROAMPHET ER 30 MG PO CP24: 30.0000 mg | ORAL_CAPSULE | Freq: Every day | ORAL | 0 refills | Status: DC

## 2021-08-07 ENCOUNTER — Other Ambulatory Visit: Payer: Self-pay | Admitting: Family Medicine

## 2021-08-07 DIAGNOSIS — F988 Other specified behavioral and emotional disorders with onset usually occurring in childhood and adolescence: Secondary | ICD-10-CM

## 2021-08-12 MED ORDER — AMPHETAMINE-DEXTROAMPHET ER 30 MG PO CP24: 30.0000 mg | ORAL_CAPSULE | Freq: Every day | ORAL | 0 refills | Status: DC

## 2021-09-10 ENCOUNTER — Other Ambulatory Visit: Payer: Self-pay | Admitting: Family Medicine

## 2021-09-10 DIAGNOSIS — F988 Other specified behavioral and emotional disorders with onset usually occurring in childhood and adolescence: Secondary | ICD-10-CM

## 2021-09-10 MED ORDER — AMPHETAMINE-DEXTROAMPHET ER 30 MG PO CP24: 30.0000 mg | ORAL_CAPSULE | Freq: Every day | ORAL | 0 refills | Status: DC

## 2021-10-07 ENCOUNTER — Other Ambulatory Visit: Payer: Self-pay | Admitting: Family Medicine

## 2021-10-07 DIAGNOSIS — F988 Other specified behavioral and emotional disorders with onset usually occurring in childhood and adolescence: Secondary | ICD-10-CM

## 2021-10-08 MED ORDER — AMPHETAMINE-DEXTROAMPHET ER 30 MG PO CP24: 30.0000 mg | ORAL_CAPSULE | Freq: Every day | ORAL | 0 refills | Status: DC

## 2021-10-09 ENCOUNTER — Telehealth: Payer: Self-pay | Admitting: Family Medicine

## 2021-10-09 NOTE — Telephone Encounter (Signed)
Pt states pharmacy says she can't pick up prescription amphetamine-dextroamphetamine (ADDERALL XR) 30 MG 24 hr capsule until 09/10/21. Leaving out of town later today for 1 1/2 week. Patient is asking that someone call to release that medication for her to pick up today

## 2021-10-09 NOTE — Telephone Encounter (Signed)
Per pcp okay to authorize early refill. I called and spoke with the pharmacist, they will fill Rx for patient.

## 2021-11-07 ENCOUNTER — Other Ambulatory Visit: Payer: Self-pay | Admitting: Family Medicine

## 2021-11-07 DIAGNOSIS — F988 Other specified behavioral and emotional disorders with onset usually occurring in childhood and adolescence: Secondary | ICD-10-CM

## 2021-11-12 NOTE — Progress Notes (Unsigned)
Megan Lewis is a 41 y.o. female with medical hx significant for anxiety,GERD,and IBS here today to follow on ADD.  Last f/u visit: 05/29/21  Currently she is on Adderall XR 30 mg, which she takes most days. Dx'ed around 2015.  She is tolerating medication well, no side effects reported. Medication helps her with concentration and focusing at work, less distracted and being more organized.  Negative for depression or anxiety symptoms. Sleeps well, 8 hours.  She denies any illicit drug use and keeps medication in a safe place.  Review of Systems  Constitutional: Negative for activity change, appetite change, fatigue and unexpected weight change.  Respiratory: Negative for shortness of breath and wheezing.   Cardiovascular: Negative for chest pain, palpitations and leg swelling.  Gastrointestinal: Negative for abdominal pain, nausea and vomiting.   No changes in bowel habits.  Neurological: Negative for tremors and headaches.  Psychiatric/Behavioral: Negative for behavioral problems and tics.  Active Ambulatory Problems    Diagnosis Date Noted   ADD (attention deficit disorder) 02/04/2013   GERD (gastroesophageal reflux disease) 04/25/2016   IBS (irritable bowel syndrome) 04/25/2016   Anxiety disorder, unspecified 02/13/2017   Resolved Ambulatory Problems    Diagnosis Date Noted   No Resolved Ambulatory Problems   No Additional Past Medical History   Current Outpatient Medications on File Prior to Visit  Medication Sig Dispense Refill   acetaminophen (TYLENOL) 500 MG tablet Take 1,000 mg by mouth every 6 (six) hours as needed for moderate pain.     levonorgestrel (MIRENA) 20 MCG/24HR IUD 1 each by Intrauterine route once.     valACYclovir (VALTREX) 500 MG tablet 4 TABLETS BY MOUTH AT FIRST SIGHT OF OUTBREAK AND 4 TABS 6 HOURS LATER  2   No current facility-administered medications on file prior to visit.    Allergies  Allergen Reactions   Sulfa Antibiotics  Anaphylaxis   Nsaids Other (See Comments)    Severe burning from stomach to back    Social History   Socioeconomic History   Marital status: Married    Spouse name: Not on file   Number of children: Not on file   Years of education: Not on file   Highest education level: Associate degree: occupational, Hotel manager, or vocational program  Occupational History   Not on file  Tobacco Use   Smoking status: Never   Smokeless tobacco: Never  Vaping Use   Vaping Use: Never used  Substance and Sexual Activity   Alcohol use: Yes    Comment: occas   Drug use: No   Sexual activity: Yes    Birth control/protection: I.U.D.  Other Topics Concern   Not on file  Social History Narrative   Not on file   Social Determinants of Health   Financial Resource Strain: Low Risk  (11/13/2021)   Overall Financial Resource Strain (CARDIA)    Difficulty of Paying Living Expenses: Not hard at all  Food Insecurity: No Food Insecurity (11/13/2021)   Hunger Vital Sign    Worried About Running Out of Food in the Last Year: Never true    Ran Out of Food in the Last Year: Never true  Transportation Needs: No Transportation Needs (11/13/2021)   PRAPARE - Hydrologist (Medical): No    Lack of Transportation (Non-Medical): No  Physical Activity: Insufficiently Active (11/13/2021)   Exercise Vital Sign    Days of Exercise per Week: 3 days    Minutes of Exercise per Session: 30  min  Stress: No Stress Concern Present (11/13/2021)   [place]    Feeling of Stress : Not at all  Social Connections: [place] (11/13/2021)   Social Connection and Isolation Panel [NHANES]    Frequency of Communication with Friends and Family: More than three times a week    Frequency of Social Gatherings with Friends and Family: More than three times a week    Attends Religious Services: More than 4 times per year    Active  Member of [employer] or Organizations: Yes    Attends Music therapist: More than 4 times per year    Marital Status: Married  Human resources officer Violence: Not on file   BP 120/70 (BP Location: Right Arm, Patient Position: Sitting, Cuff Size: Normal)   Pulse 98   Temp 98.8 F (37.1 C) (Oral)   Resp 12   Ht '5\' 4"'$  (1.626 m)   Wt 166 lb (75.3 kg)   SpO2 99%   BMI 28.49 kg/m   Physical Exam  Nursing note and vitals reviewed. Constitutional: She is oriented to person, place, and time. She appears well-developed. No distress.  HENT:  Head: Normocephalic and atraumatic.  Mouth/Throat: Oropharynx is clear and moist. Eyes: Conjunctivae are normal.  Cardiovascular: Normal rate and regular rhythm.  No murmur heard. Respiratory: Effort normal and breath sounds normal. No respiratory distress.  Musculoskeletal:        General: No edema.  Neurological: She is alert and oriented to person, place, and time. She has normal strength. Gait normal.  Skin: Skin is warm. No erythema.  Psychiatric: She has a normal mood and affect.  Well groomed,good eye contact.   ASSESSMENT AND PLAN:  [name] was seen today for follow-up.  Diagnoses and all orders for this visit:  ADD (attention deficit disorder) Problem is well controlled. Continue Adderall XR 30 mg daily. We discussed some side effects. Med contract 05/29/21. PMP reviewed. Urine tox 05/2021. F/U in 5-6 months, before if needed.  Return in about 25 weeks (around 05/07/2022).  Megan Lewis [name], MD  [facility]. [place] office.

## 2021-11-13 ENCOUNTER — Ambulatory Visit: Payer: 59 | Admitting: Family Medicine

## 2021-11-13 VITALS — BP 120/70 | HR 98 | Temp 98.8°F | Resp 12 | Ht 64.0 in | Wt 166.0 lb

## 2021-11-13 DIAGNOSIS — F988 Other specified behavioral and emotional disorders with onset usually occurring in childhood and adolescence: Secondary | ICD-10-CM

## 2021-11-13 MED ORDER — AMPHETAMINE-DEXTROAMPHET ER 30 MG PO CP24
30.0000 mg | ORAL_CAPSULE | Freq: Every day | ORAL | 0 refills | Status: DC
Start: 2021-11-13 — End: 2021-12-10

## 2021-11-13 NOTE — Patient Instructions (Signed)
A few things to remember from today's visit:  Attention deficit disorder, unspecified hyperactivity presence - Plan: amphetamine-dextroamphetamine (ADDERALL XR) 30 MG 24 hr capsule  If you need refills for medications you take chronically, please call your pharmacy. Do not use My Chart to request refills or for acute issues that need immediate attention. If you send a my chart message, it may take a few days to be addressed, specially if I am not in the office.  Please be sure medication list is accurate. If a new problem present, please set up appointment sooner than planned today.  No changes today. We will continue following every 6 months.

## 2021-11-13 NOTE — Assessment & Plan Note (Signed)
Problem is well controlled. Continue Adderall XR 30 mg daily. We discussed some side effects. Med contract 05/29/21. PMP reviewed. Urine tox 05/2021. F/U in 5-6 months, before if needed.

## 2021-12-10 ENCOUNTER — Other Ambulatory Visit: Payer: Self-pay | Admitting: Family Medicine

## 2021-12-10 DIAGNOSIS — F988 Other specified behavioral and emotional disorders with onset usually occurring in childhood and adolescence: Secondary | ICD-10-CM

## 2021-12-16 MED ORDER — AMPHETAMINE-DEXTROAMPHET ER 30 MG PO CP24: 30.0000 mg | ORAL_CAPSULE | Freq: Every day | ORAL | 0 refills | Status: DC

## 2022-01-14 ENCOUNTER — Other Ambulatory Visit: Payer: Self-pay | Admitting: Family Medicine

## 2022-01-14 DIAGNOSIS — F988 Other specified behavioral and emotional disorders with onset usually occurring in childhood and adolescence: Secondary | ICD-10-CM

## 2022-01-14 NOTE — Telephone Encounter (Signed)
Last OV 11/13/21 Next OV in 5-6 months Last filled 12/16/21

## 2022-01-14 NOTE — Telephone Encounter (Signed)
Refill amphetamine-dextroamphetamine (ADDERALL XR) 30 MG 24 hr capsule   [id number] [address]: [phone] Fax: [phone]

## 2022-01-15 MED ORDER — AMPHETAMINE-DEXTROAMPHET ER 30 MG PO CP24: 30.0000 mg | ORAL_CAPSULE | Freq: Every day | ORAL | 0 refills | Status: DC

## 2022-02-17 ENCOUNTER — Other Ambulatory Visit: Payer: Self-pay | Admitting: Family Medicine

## 2022-02-17 DIAGNOSIS — F988 Other specified behavioral and emotional disorders with onset usually occurring in childhood and adolescence: Secondary | ICD-10-CM

## 2022-02-17 NOTE — Telephone Encounter (Signed)
Pt is calling and would like amphetamine-dextroamphetamine (ADDERALL XR) 30 MG 24 hr capsule   [id number] [facility] [address] Phone: [phone] Fax: [phone]

## 2022-02-21 MED ORDER — AMPHETAMINE-DEXTROAMPHET ER 30 MG PO CP24: 30.0000 mg | ORAL_CAPSULE | Freq: Every day | ORAL | 0 refills | Status: DC

## 2022-02-21 NOTE — Telephone Encounter (Signed)
Patient calling to check on progress of refill.

## 2022-03-25 NOTE — Progress Notes (Signed)
RX sent

## 2022-03-26 ENCOUNTER — Other Ambulatory Visit: Payer: Self-pay | Admitting: Family Medicine

## 2022-03-26 DIAGNOSIS — F988 Other specified behavioral and emotional disorders with onset usually occurring in childhood and adolescence: Secondary | ICD-10-CM

## 2022-03-26 MED ORDER — AMPHETAMINE-DEXTROAMPHET ER 30 MG PO CP24: 30.0000 mg | ORAL_CAPSULE | Freq: Every day | ORAL | 0 refills | Status: DC

## 2022-03-26 NOTE — Telephone Encounter (Signed)
Prescription Request  03/26/2022  Is this a "Controlled Substance" medicine? Yes  LOV: 11/13/2021  What is the name of the medication or equipment? amphetamine-dextroamphetamine (ADDERALL XR) 30 MG 24 hr capsule  Have you contacted your pharmacy to request a refill? No   Which pharmacy would you like this sent to?  [id number] [place] [address] [place] [place]: [phone]: [phone]   Patient notified that their request is being sent to the clinical staff for review and that they should receive a response within 2 business days.   Please advise at Mobile [phone](mobile)

## 2022-05-02 ENCOUNTER — Other Ambulatory Visit: Payer: Self-pay | Admitting: Family Medicine

## 2022-05-02 DIAGNOSIS — F988 Other specified behavioral and emotional disorders with onset usually occurring in childhood and adolescence: Secondary | ICD-10-CM

## 2022-05-02 NOTE — Telephone Encounter (Signed)
Prescription Request  05/02/2022   LOV: 11/13/2021  What is the name of the medication or equipment? amphetamine-dextroamphetamine (ADDERALL XR) 30 MG 24 hr capsule  Have you contacted your pharmacy to request a refill? No   Which pharmacy would you like this sent to?  [id number] [place]: [phone]: [phone]    Patient notified that their request is being sent to the clinical staff for review and that they should receive a response within 2 business days.   Please advise at Mobile [phone](mobile)

## 2022-05-05 MED ORDER — AMPHETAMINE-DEXTROAMPHET ER 30 MG PO CP24: 30.0000 mg | ORAL_CAPSULE | Freq: Every day | ORAL | 0 refills | Status: DC

## 2022-06-06 ENCOUNTER — Telehealth: Payer: Self-pay | Admitting: Family Medicine

## 2022-06-06 NOTE — Telephone Encounter (Signed)
Overdue for follow up. ?Thanks, ?BJ ?

## 2022-06-06 NOTE — Telephone Encounter (Signed)
Prescription Request  06/06/2022  LOV: 11/13/2021  What is the name of the medication or equipment? amphetamine-dextroamphetamine (ADDERALL XR) 30 MG 24 hr capsule  Have you contacted your pharmacy to request a refill? No   Which pharmacy would you like this sent to?  [id number] [place], [place] - [address] [place] [address] Phone: [phone] Fax: [phone]     Patient notified that their request is being sent to the clinical staff for review and that they should receive a response within 2 business days.   Please advise at Mobile [phone] (mobile)

## 2022-06-09 ENCOUNTER — Telehealth (INDEPENDENT_AMBULATORY_CARE_PROVIDER_SITE_OTHER): Payer: 59 | Admitting: Family Medicine

## 2022-06-09 ENCOUNTER — Encounter: Payer: Self-pay | Admitting: Family Medicine

## 2022-06-09 VITALS — Ht 64.0 in

## 2022-06-09 DIAGNOSIS — F988 Other specified behavioral and emotional disorders with onset usually occurring in childhood and adolescence: Secondary | ICD-10-CM

## 2022-06-09 MED ORDER — AMPHETAMINE-DEXTROAMPHET ER 30 MG PO CP24: 30.0000 mg | ORAL_CAPSULE | Freq: Every day | ORAL | 0 refills | Status: DC

## 2022-06-09 NOTE — Assessment & Plan Note (Signed)
Problem is stable, continue Adderall XR 30 mg daily. We discussed some side effects. Med contract 05/29/21. PMP reviewed. F/U in 5-6 months, before if needed.

## 2022-06-09 NOTE — Progress Notes (Signed)
Virtual Visit via Video Note I connected with Megan Lewis on 06/09/22 by a video enabled telemedicine application and verified that I am speaking with the correct person using two identifiers. [place] patient[place]. Location provider:work office Persons participating in the virtual visit: patient, provider  I discussed the limitations of evaluation and management by telemedicine and the availability of in person appointments. The patient expressed understanding and agreed to proceed.  Chief Complaint  Patient presents with   ADHD   HPI: Megan Lewis is a 42 yo female following on ADD. She was last seen on 11/13/21. No new problems since her last visit.  She is  currently taking Adderall XR 30 mg daily. She takes medication most days.  She states that the medication remains effective, and she has not experienced any side effects.  Less distractible and more organized, able to complete tasks on time.  Dx'ed around 2015.  She mentions sleeping an average of six to eight hours per night.   Negative for fever,changes in appetite, severe/frequent headache,chest pain, palpitation, abdominal pain,tremor,or edema.  ROS: See pertinent positives and negatives per HPI.  Past Medical History:  Diagnosis Date   ADD (attention deficit disorder)    GERD (gastroesophageal reflux disease)     Past Surgical History:  Procedure Laterality Date   CYST EXCISION N/A 02/16/2019   Procedure: EXCISION OF SEBACEOUS CYST -SCALP;  Surgeon: Manus Rudd, MD;  Location: [facility];  Service: General;  Laterality: N/A;    Family History  Problem Relation Age of Onset   Hyperlipidemia Mother    Heart disease Mother    Hypertension Mother    Hyperlipidemia Father    Heart disease Father    Hypertension Father    Cancer Maternal Grandmother        breast   Cancer Paternal Grandfather        lung    Social History   Socioeconomic History   Marital status: Married     Spouse name: Not on file   Number of children: Not on file   Years of education: Not on file   Highest education level: Associate degree: occupational, Scientist, product/process development, or vocational program  Occupational History   Not on file  Tobacco Use   Smoking status: Never   Smokeless tobacco: Never  Vaping Use   Vaping Use: Never used  Substance and Sexual Activity   Alcohol use: Yes    Comment: occas   Drug use: No   Sexual activity: Yes    Birth control/protection: I.U.D.  Other Topics Concern   Not on file  Social History Narrative   Not on file   Social Determinants of Health   Financial Resource Strain: Low Risk  (11/13/2021)   Overall Financial Resource Strain (CARDIA)    Difficulty of Paying Living Expenses: Not hard at all  Food Insecurity: No Food Insecurity (11/13/2021)   Hunger Vital Sign    Worried About Running Out of Food in the Last Year: Never true    Ran Out of Food in the Last Year: Never true  Transportation Needs: No Transportation Needs (11/13/2021)   PRAPARE - Administrator, Civil Service (Medical): No    Lack of Transportation (Non-Medical): No  Physical Activity: Insufficiently Active (11/13/2021)   Exercise Vital Sign    Days of Exercise per Week: 3 days    Minutes of Exercise per Session: 30 min  Stress: No Stress Concern Present (11/13/2021)   [employer] of Occupational Health -  Occupational Stress Questionnaire    Feeling of Stress : Not at all  Social Connections: Socially Integrated (11/13/2021)   Social Connection and Isolation Panel [NHANES]    Frequency of Communication with Friends and Family: More than three times a week    Frequency of Social Gatherings with Friends and Family: More than three times a week    Attends Religious Services: More than 4 times per year    Active Member of [employer] or Organizations: Yes    Attends Engineer, structural: More than 4 times per year    Marital Status: Married  Catering manager  Violence: Not on file     Current Outpatient Medications:    acetaminophen (TYLENOL) 500 MG tablet, Take 1,000 mg by mouth every 6 (six) hours as needed for moderate pain., Disp: , Rfl:    levonorgestrel (MIRENA) 20 MCG/24HR IUD, 1 each by Intrauterine route once., Disp: , Rfl:    valACYclovir (VALTREX) 500 MG tablet, 4 TABLETS BY MOUTH AT FIRST SIGHT OF OUTBREAK AND 4 TABS 6 HOURS LATER, Disp: , Rfl: 2   amphetamine-dextroamphetamine (ADDERALL XR) 30 MG 24 hr capsule, Take 1 capsule (30 mg total) by mouth daily., Disp: 30 capsule, Rfl: 0   amphetamine-dextroamphetamine (ADDERALL XR) 30 MG 24 hr capsule, Take 1 capsule (30 mg total) by mouth daily., Disp: 30 capsule, Rfl: 0  EXAM:  VITALS per patient if applicable:Ht 5\' 4"  (1.626 m)   BMI 28.49 kg/m   GENERAL: alert, oriented, appears well and in no acute distress  HEENT: atraumatic, conjunctiva clear, no obvious abnormalities on inspection.  LUNGS: on inspection no signs of respiratory distress, breathing rate appears normal, no obvious gross SOB, gasping or wheezing  CV: no obvious cyanosis  PSYCH/NEURO: pleasant and cooperative, no obvious depression or anxiety, speech and thought processing grossly intact  ASSESSMENT AND PLAN:  Discussed the following assessment and plan:  Attention deficit disorder, unspecified hyperactivity presence Assessment & Plan: Problem is stable, continue Adderall XR 30 mg daily. We discussed some side effects. Med contract 05/29/21. PMP reviewed. F/U in 5-6 months, before if needed.  Orders: -     Amphetamine-Dextroamphet ER; Take 1 capsule (30 mg total) by mouth daily.  Dispense: 30 capsule; Refill: 0 -     Amphetamine-Dextroamphet ER; Take 1 capsule (30 mg total) by mouth daily.  Dispense: 30 capsule; Refill: 0    We discussed possible serious and likely etiologies, options for evaluation and workup, limitations of telemedicine visit vs in person visit, treatment, treatment risks and  precautions. The patient was advised to call back or seek an in-person evaluation if the symptoms worsen or if the condition fails to improve as anticipated. I discussed the assessment and treatment plan with the patient. The patient was provided an opportunity to ask questions and all were answered. The patient agreed with the plan and demonstrated an understanding of the instructions.  Return in about 5 months (around 11/09/2022) for chronic problems.  Eiza Canniff G. [place], MD  [facility]. [place] office.

## 2022-06-09 NOTE — Telephone Encounter (Signed)
I spoke with patient, appt scheduled for this afternoon at 4:30. VV okay due to last OV in September being in office.

## 2022-09-02 ENCOUNTER — Other Ambulatory Visit: Payer: Self-pay | Admitting: Family Medicine

## 2022-09-02 DIAGNOSIS — F988 Other specified behavioral and emotional disorders with onset usually occurring in childhood and adolescence: Secondary | ICD-10-CM

## 2022-09-02 NOTE — Telephone Encounter (Signed)
-   last OV 06/09/22

## 2022-09-08 MED ORDER — AMPHETAMINE-DEXTROAMPHET ER 30 MG PO CP24: 30.0000 mg | ORAL_CAPSULE | Freq: Every day | ORAL | 0 refills | Status: DC

## 2022-10-20 ENCOUNTER — Other Ambulatory Visit: Payer: Self-pay | Admitting: Family Medicine

## 2022-10-20 DIAGNOSIS — F988 Other specified behavioral and emotional disorders with onset usually occurring in childhood and adolescence: Secondary | ICD-10-CM

## 2022-10-20 NOTE — Telephone Encounter (Signed)
Prescription Request  10/20/2022  LOV: 11/13/2021  What is the name of the medication or equipment? amphetamine-dextroamphetamine amphetamine-dextroamphetamine (ADDERALL XR) 30 MG 24 hr capsule  Have you contacted your pharmacy to request a refill? No   Which pharmacy would you like this sent to?   [id number] [place], [place] - [address] Phone: [phone]  Fax: [phone]      Patient notified that their request is being sent to the clinical staff for review and that they should receive a response within 2 business days.   Please advise at Mobile [phone] (mobile)

## 2022-10-20 NOTE — Telephone Encounter (Signed)
LOV 06/09/22

## 2022-10-22 MED ORDER — AMPHETAMINE-DEXTROAMPHET ER 30 MG PO CP24: 30.0000 mg | ORAL_CAPSULE | Freq: Every day | ORAL | 0 refills | Status: DC

## 2022-12-15 ENCOUNTER — Other Ambulatory Visit: Payer: Self-pay | Admitting: Family Medicine

## 2022-12-15 DIAGNOSIS — F908 Attention-deficit hyperactivity disorder, other type: Secondary | ICD-10-CM

## 2022-12-15 NOTE — Telephone Encounter (Signed)
Prescription Request  12/15/2022  LOV: Visit date not found  What is the name of the medication or equipment? amphetamine-dextroamphetamine (ADDERALL XR) 30 MG 24 hr capsule   Have you contacted your pharmacy to request a refill? No   Which pharmacy would you like this sent to?  [id number] [place], [place] - [address] [place] [address] Phone: [phone] Fax: [phone]     Patient notified that their request is being sent to the clinical staff for review and that they should receive a response within 2 business days.   Please advise at Mobile [phone] (mobile)

## 2022-12-15 NOTE — Telephone Encounter (Signed)
Needs to schedule visit before we can send in.

## 2022-12-16 NOTE — Telephone Encounter (Signed)
Pt has mychart visit tomorrow 12-17-2022

## 2022-12-17 ENCOUNTER — Encounter: Payer: Self-pay | Admitting: Family Medicine

## 2022-12-17 ENCOUNTER — Telehealth (INDEPENDENT_AMBULATORY_CARE_PROVIDER_SITE_OTHER): Payer: 59 | Admitting: Family Medicine

## 2022-12-17 VITALS — Ht 64.0 in

## 2022-12-17 DIAGNOSIS — R41840 Attention and concentration deficit: Secondary | ICD-10-CM | POA: Diagnosis not present

## 2022-12-17 MED ORDER — AMPHETAMINE-DEXTROAMPHET ER 30 MG PO CP24: 30.0000 mg | ORAL_CAPSULE | Freq: Every day | ORAL | 0 refills | Status: DC

## 2022-12-17 NOTE — Progress Notes (Signed)
Virtual Visit via Video Note I connected with Megan Lewis on 12/17/2022 by a video enabled telemedicine application and verified that I am speaking with the correct person using two identifiers. Location patient: home Location provider:work office Persons participating in the virtual visit: patient, scribe,provider  I discussed the limitations of evaluation and management by telemedicine and the availability of in person appointments. The patient expressed understanding and agreed to proceed.  Chief Complaint  Patient presents with   ADHD   HPI: Megan Lewis is a 42 y.o. female with a PMHx significant for anxiety, ADD, GERD, and IBS who is being seen on video today for medication follow up.   ADD dx'ed around 2015. She is still taking Adderall XR 30 mg daily and says it is helping with her ADD. She reports no problems with the medication.  Medication helps her with concentration and focusing at work, less distracted and being more organized.  She says she is sleeping 6-8 hours per night.  She denies anxiety, depression, or tics. Negative for changes in appetite, wt loss, CP,palpitations, abdominal pain,nausea,tremor,or headache.  ROS: See pertinent positives and negatives per HPI.  Past Medical History:  Diagnosis Date   ADD (attention deficit disorder)    GERD (gastroesophageal reflux disease)     Past Surgical History:  Procedure Laterality Date   CYST EXCISION N/A 02/16/2019   Procedure: EXCISION OF SEBACEOUS CYST -SCALP;  Surgeon: Manus Rudd, MD;  Location: [facility];  Service: General;  Laterality: N/A;    Family History  Problem Relation Age of Onset   Hyperlipidemia Mother    Heart disease Mother    Hypertension Mother    Hyperlipidemia Father    Heart disease Father    Hypertension Father    Cancer Maternal Grandmother        breast   Cancer Paternal Grandfather        lung    Social History   Socioeconomic History    Marital status: Married    Spouse name: Not on file   Number of children: Not on file   Years of education: Not on file   Highest education level: Associate degree: occupational, Scientist, product/process development, or vocational program  Occupational History   Not on file  Tobacco Use   Smoking status: Never   Smokeless tobacco: Never  Vaping Use   Vaping status: Never Used  Substance and Sexual Activity   Alcohol use: Yes    Comment: occas   Drug use: No   Sexual activity: Yes    Birth control/protection: I.U.D.  Other Topics Concern   Not on file  Social History Narrative   Not on file   Social Determinants of Health   Financial Resource Strain: Low Risk  (11/13/2021)   Overall Financial Resource Strain (CARDIA)    Difficulty of Paying Living Expenses: Not hard at all  Food Insecurity: No Food Insecurity (11/13/2021)   Hunger Vital Sign    Worried About Running Out of Food in the Last Year: Never true    Ran Out of Food in the Last Year: Never true  Transportation Needs: No Transportation Needs (11/13/2021)   PRAPARE - Administrator, Civil Service (Medical): No    Lack of Transportation (Non-Medical): No  Physical Activity: Insufficiently Active (11/13/2021)   Exercise Vital Sign    Days of Exercise per Week: 3 days    Minutes of Exercise per Session: 30 min  Stress: No Stress Concern Present (11/13/2021)  [employer] of Occupational Health - Occupational Stress Questionnaire    Feeling of Stress : Not at all  Social Connections: Socially Integrated (11/13/2021)   Social Connection and Isolation Panel [NHANES]    Frequency of Communication with Friends and Family: More than three times a week    Frequency of Social Gatherings with Friends and Family: More than three times a week    Attends Religious Services: More than 4 times per year    Active Member of [employer] or Organizations: Yes    Attends Engineer, structural: More than 4 times per year    Marital Status: Married   Catering manager Violence: Not on file    Current Outpatient Medications:    acetaminophen (TYLENOL) 500 MG tablet, Take 1,000 mg by mouth every 6 (six) hours as needed for moderate pain., Disp: , Rfl:    levonorgestrel (MIRENA) 20 MCG/24HR IUD, 1 each by Intrauterine route once., Disp: , Rfl:    valACYclovir (VALTREX) 500 MG tablet, 4 TABLETS BY MOUTH AT FIRST SIGHT OF OUTBREAK AND 4 TABS 6 HOURS LATER, Disp: , Rfl: 2   amphetamine-dextroamphetamine (ADDERALL XR) 30 MG 24 hr capsule, Take 1 capsule (30 mg total) by mouth daily., Disp: 30 capsule, Rfl: 0   amphetamine-dextroamphetamine (ADDERALL XR) 30 MG 24 hr capsule, Take 1 capsule (30 mg total) by mouth daily., Disp: 30 capsule, Rfl: 0  EXAM:  VITALS per patient if applicable:Ht 5\' 4"  (1.626 m)   BMI 28.49 kg/m   GENERAL: alert, oriented, appears well and in no acute distress  HEENT: atraumatic, conjunctiva clear, no obvious abnormalities on inspection of external nose and ears  NECK: normal movements of the head and neck  LUNGS: on inspection no signs of respiratory distress, breathing rate appears normal, no obvious gross SOB, gasping or wheezing  CV: no obvious cyanosis  MS: moves all visible extremities without noticeable abnormality  PSYCH/NEURO: pleasant and cooperative, no obvious depression or anxiety, speech and thought processing grossly intact  ASSESSMENT AND PLAN:  Discussed the following assessment and plan:  Attention or concentration deficit Assessment & Plan: Problem is reported as well controlled, tolerating medication well. Continue Adderall XR 30 mg daily, Rx's x 2 sent. We reviewed some side effects. Med contract 05/29/21, was planning on renewing it today but we did virtual visit. PDMP reviewed. F/U in 5-6 months, in office visit.  Orders: -     Amphetamine-Dextroamphet ER; Take 1 capsule (30 mg total) by mouth daily.  Dispense: 30 capsule; Refill: 0   We discussed possible serious and likely  etiologies, options for evaluation and workup, limitations of telemedicine visit vs in person visit, treatment, treatment risks and precautions. The patient was advised to call back or seek an in-person evaluation if the symptoms worsen or if the condition fails to improve as anticipated. I discussed the assessment and treatment plan with the patient. The patient was provided an opportunity to ask questions and all were answered. The patient agreed with the plan and demonstrated an understanding of the instructions.  I, Rolla Etienne Wierda, acting as a scribe for Alishia Lebo [place], MD., have documented all relevant documentation on the behalf of Megan Jahr [place], MD, as directed by  Megan Tutton [place], MD while in the presence of Megan Badalamenti [place], MD.   I, Tenille Morrill [place], MD, have reviewed all documentation for this visit. The documentation on 12/17/22 for the exam, diagnosis, procedures, and orders are all accurate and complete.  No follow-ups on file.  Georgann Bramble [place], MD

## 2022-12-17 NOTE — Assessment & Plan Note (Addendum)
Problem is reported as well controlled, tolerating medication well. Continue Adderall XR 30 mg daily, Rx's x 2 sent. We reviewed some side effects. Med contract 05/29/21, was planning on renewing it today but we did virtual visit. PDMP reviewed. F/U in 5-6 months, in office visit.

## 2023-03-03 ENCOUNTER — Ambulatory Visit: Payer: 59 | Admitting: Internal Medicine

## 2023-03-23 ENCOUNTER — Other Ambulatory Visit: Payer: Self-pay | Admitting: Family Medicine

## 2023-03-23 DIAGNOSIS — F908 Attention-deficit hyperactivity disorder, other type: Secondary | ICD-10-CM

## 2023-03-23 NOTE — Telephone Encounter (Signed)
 Last OV 12/17/22

## 2023-03-24 MED ORDER — AMPHETAMINE-DEXTROAMPHET ER 30 MG PO CP24: 30.0000 mg | ORAL_CAPSULE | Freq: Every day | ORAL | 0 refills | Status: DC

## 2023-05-04 ENCOUNTER — Other Ambulatory Visit: Payer: Self-pay | Admitting: Family Medicine

## 2023-05-04 DIAGNOSIS — F908 Attention-deficit hyperactivity disorder, other type: Secondary | ICD-10-CM

## 2023-05-05 MED ORDER — AMPHETAMINE-DEXTROAMPHET ER 30 MG PO CP24: 30.0000 mg | ORAL_CAPSULE | Freq: Every day | ORAL | 0 refills | Status: DC

## 2023-05-11 ENCOUNTER — Encounter (HOSPITAL_BASED_OUTPATIENT_CLINIC_OR_DEPARTMENT_OTHER): Payer: Self-pay

## 2023-05-11 ENCOUNTER — Other Ambulatory Visit: Payer: Self-pay

## 2023-05-11 DIAGNOSIS — R112 Nausea with vomiting, unspecified: Secondary | ICD-10-CM | POA: Insufficient documentation

## 2023-05-11 DIAGNOSIS — R1013 Epigastric pain: Secondary | ICD-10-CM | POA: Insufficient documentation

## 2023-05-11 LAB — CBC
HCT: 40 % (ref 36.0–46.0)
Hemoglobin: 13.9 g/dL (ref 12.0–15.0)
MCH: 31.2 pg (ref 26.0–34.0)
MCHC: 34.8 g/dL (ref 30.0–36.0)
MCV: 89.7 fL (ref 80.0–100.0)
Platelets: 217 10*3/uL (ref 150–400)
RBC: 4.46 MIL/uL (ref 3.87–5.11)
RDW: 12.3 % (ref 11.5–15.5)
WBC: 8 10*3/uL (ref 4.0–10.5)
nRBC: 0 % (ref 0.0–0.2)

## 2023-05-11 LAB — URINALYSIS, ROUTINE W REFLEX MICROSCOPIC
Bilirubin Urine: NEGATIVE
Glucose, UA: NEGATIVE mg/dL
Hgb urine dipstick: NEGATIVE
Ketones, ur: NEGATIVE mg/dL
Leukocytes,Ua: NEGATIVE
Nitrite: NEGATIVE
Protein, ur: NEGATIVE mg/dL
Specific Gravity, Urine: 1.016 (ref 1.005–1.030)
pH: 7.5 (ref 5.0–8.0)

## 2023-05-11 LAB — COMPREHENSIVE METABOLIC PANEL
ALT: 9 U/L (ref 0–44)
AST: 11 U/L — ABNORMAL LOW (ref 15–41)
Albumin: 4.4 g/dL (ref 3.5–5.0)
Alkaline Phosphatase: 67 U/L (ref 38–126)
Anion gap: 6 (ref 5–15)
BUN: 9 mg/dL (ref 6–20)
CO2: 28 mmol/L (ref 22–32)
Calcium: 9.7 mg/dL (ref 8.9–10.3)
Chloride: 102 mmol/L (ref 98–111)
Creatinine, Ser: 0.62 mg/dL (ref 0.44–1.00)
GFR, Estimated: >60 mL/min (ref >60)
Glucose, Bld: 97 mg/dL (ref 70–99)
Potassium: 3.7 mmol/L (ref 3.5–5.1)
Sodium: 136 mmol/L (ref 135–145)
Total Bilirubin: 0.7 mg/dL (ref 0.0–1.2)
Total Protein: 6.8 g/dL (ref 6.5–8.1)

## 2023-05-11 LAB — LIPASE, BLOOD: Lipase: 32 U/L (ref 11–51)

## 2023-05-11 LAB — PREGNANCY, URINE: Preg Test, Ur: NEGATIVE

## 2023-05-11 MED ORDER — ONDANSETRON HCL 4 MG/2ML IJ SOLN
4.0000 mg | Freq: Once | INTRAMUSCULAR | Status: AC | PRN
  Administered 2023-05-11: 4 mg via INTRAVENOUS
  Filled 2023-05-11: qty 2

## 2023-05-11 NOTE — ED Triage Notes (Signed)
 GI issues in the past. Woke up yesterday morning vomiting. Patient states she's vomiting yellow/green bile.

## 2023-05-12 ENCOUNTER — Emergency Department (HOSPITAL_BASED_OUTPATIENT_CLINIC_OR_DEPARTMENT_OTHER)
Admission: EM | Admit: 2023-05-12 | Discharge: 2023-05-12 | Disposition: A | Discharge status: Home / Self Care | Attending: Emergency Medicine | Admitting: Emergency Medicine

## 2023-05-12 DIAGNOSIS — R1013 Epigastric pain: Secondary | ICD-10-CM

## 2023-05-12 MED ORDER — PANTOPRAZOLE SODIUM 40 MG PO TBEC: 40.0000 mg | DELAYED_RELEASE_TABLET | Freq: Every day | ORAL | 0 refills | Status: AC

## 2023-05-12 MED ORDER — LIDOCAINE VISCOUS HCL 2 % MT SOLN
15.0000 mL | Freq: Once | OROMUCOSAL | Status: AC
  Administered 2023-05-12: 15 mL via ORAL
  Filled 2023-05-12: qty 15

## 2023-05-12 MED ORDER — ONDANSETRON 4 MG PO TBDP: 4.0000 mg | ORAL_TABLET | Freq: Three times a day (TID) | ORAL | 0 refills | Status: AC | PRN

## 2023-05-12 MED ORDER — FAMOTIDINE 40 MG PO TABS: 40.0000 mg | ORAL_TABLET | Freq: Every day | ORAL | 0 refills | Status: AC

## 2023-05-12 MED ORDER — ALUM & MAG HYDROXIDE-SIMETH 200-200-20 MG/5ML PO SUSP
30.0000 mL | Freq: Once | ORAL | Status: AC
  Administered 2023-05-12: 30 mL via ORAL
  Filled 2023-05-12: qty 30

## 2023-05-12 NOTE — ED Provider Notes (Signed)
 [place] EMERGENCY DEPARTMENT AT [facility] Provider Note   CSN: [id number] Arrival date & time: 05/11/23  2019     History  Chief Complaint  Patient presents with   Abdominal Pain    Megan Lewis is a 43 y.o. female.   Abdominal Pain Associated symptoms: nausea and vomiting      43 year old female presenting to the emergency department with burning epigastric discomfort.  The patient also had episodes of emesis yesterday morning.  She denies any blood in her emesis.  She states that she was vomiting yellow stomach acid, no green bile.  She denies any fevers or chills.  She is passing gas and moving her bowels.  No diarrhea.  She endorses persistent burning epigastric discomfort.  Home Medications Prior to Admission medications   Medication Sig Start Date End Date Taking? Authorizing Provider  famotidine (PEPCID) 40 MG tablet Take 1 tablet (40 mg total) by mouth daily for 14 days. 05/12/23 05/26/23 Yes Ernie Avena, MD  ondansetron (ZOFRAN-ODT) 4 MG disintegrating tablet Take 1 tablet (4 mg total) by mouth every 8 (eight) hours as needed. 05/12/23  Yes Ernie Avena, MD  pantoprazole (PROTONIX) 40 MG tablet Take 1 tablet (40 mg total) by mouth daily. 05/12/23 06/11/23 Yes Ernie Avena, MD  acetaminophen (TYLENOL) 500 MG tablet Take 1,000 mg by mouth every 6 (six) hours as needed for moderate pain.    [provider]  amphetamine-dextroamphetamine (ADDERALL XR) 30 MG 24 hr capsule Take 1 capsule (30 mg total) by mouth daily. 12/17/22   [place], Betty G, MD  amphetamine-dextroamphetamine (ADDERALL XR) 30 MG 24 hr capsule Take 1 capsule (30 mg total) by mouth daily. 05/05/23   [place], Betty G, MD  levonorgestrel (MIRENA) 20 MCG/24HR IUD 1 each by Intrauterine route once.    [provider]  valACYclovir (VALTREX) 500 MG tablet 4 TABLETS BY MOUTH AT FIRST SIGHT OF OUTBREAK AND 4 TABS 6 HOURS LATER 07/03/17   [provider]      Allergies     Sulfa antibiotics and Nsaids    Review of Systems   Review of Systems  Gastrointestinal:  Positive for abdominal pain, nausea and vomiting.  All other systems reviewed and are negative.   Physical Exam Updated Vital Signs BP 128/88   Pulse 74   Temp 98.3 F (36.8 C) (Oral)   Resp 18   Ht 5\' 4"  (1.626 m)   Wt 74.8 kg   SpO2 100%   BMI 28.32 kg/m  Physical Exam Vitals and nursing note reviewed.  Constitutional:      General: She is not in acute distress.    Appearance: She is well-developed.  HENT:     Head: Normocephalic and atraumatic.  Eyes:     Conjunctiva/sclera: Conjunctivae normal.  Cardiovascular:     Rate and Rhythm: Normal rate and regular rhythm.     Heart sounds: No murmur heard. Pulmonary:     Effort: Pulmonary effort is normal. No respiratory distress.     Breath sounds: Normal breath sounds.  Abdominal:     Palpations: Abdomen is soft.     Tenderness: There is no abdominal tenderness. There is no guarding.  Musculoskeletal:        General: No swelling.     Cervical back: Neck supple.  Skin:    General: Skin is warm and dry.     Capillary Refill: Capillary refill takes less than 2 seconds.  Neurological:     Mental Status: She is  alert.  Psychiatric:        Mood and Affect: Mood normal.     ED Results / Procedures / Treatments   Labs (all labs ordered are listed, but only abnormal results are displayed) Labs Reviewed  COMPREHENSIVE METABOLIC PANEL - Abnormal; Notable for the following components:      Result Value   AST 11 (*)    All other components within normal limits  LIPASE, BLOOD  CBC  URINALYSIS, ROUTINE W REFLEX MICROSCOPIC  PREGNANCY, URINE    EKG None  Radiology No results found.  Procedures Procedures    Medications Ordered in ED Medications  ondansetron (ZOFRAN) injection 4 mg (4 mg Intravenous Given 05/11/23 2134)  alum & mag hydroxide-simeth (MAALOX/MYLANTA) 200-200-20 MG/5ML suspension 30 mL (30 mLs Oral Given  05/12/23 0147)    And  lidocaine (XYLOCAINE) 2 % viscous mouth solution 15 mL (15 mLs Oral Given 05/12/23 0148)    ED Course/ Medical Decision Making/ A&P                                 Medical Decision Making Amount and/or Complexity of Data Reviewed Labs: ordered.  Risk OTC drugs. Prescription drug management.    43 year old female presenting to the emergency department with burning epigastric discomfort.  The patient also had episodes of emesis yesterday morning.  She denies any blood in her emesis.  She states that she was vomiting yellow stomach acid, no green bile.  She denies any fevers or chills.  She is passing gas and moving her bowels.  No diarrhea.  She endorses persistent burning epigastric discomfort.  On arrival, the patient was vitally stable, afebrile, not tachycardic or tachypneic, saturating well in room air.  Patient presenting with epigastric pain, burning discomfort.  Considered gastritis, pancreatitis, peptic ulcer disease, GERD.  Patient with a reassuring abdominal exam, do not think CT imaging is indicated at this time.  Screening laboratory evaluation revealed CMP, lipase, CBC, urinalysis and urine pregnancy ultimately unremarkable.  Patient was administered a GI cocktail and IV Zofran feeling symptomatically improved, notably tolerating oral intake.  Will prescribe Pepcid and Protonix and oral Zofran and have the patient follow-up outpatient with a gastroenterologist.  Return precautions provided.  The patient has been appropriately medically screened and/or stabilized in the ED. I have low suspicion for any other emergent medical condition which would require further screening, evaluation or treatment in the ED or require inpatient management.  DC Instructions: Your symptoms could be due to gastritis, gastric reflux or peptic ulcer.  Will start you on Pepcid and Protonix and have you follow-up outpatient with gastroenterology.  We also discharge you on Zofran.   You can also try over-the-counter Maalox.  Return for any severe worsening symptoms, inability to tolerate oral intake or development of severe abdominal pain which would prompt need for CT imaging.   Final Clinical Impression(s) / ED Diagnoses Final diagnoses:  Epigastric pain    Rx / DC Orders ED Discharge Orders          Ordered    Ambulatory referral to Gastroenterology        05/12/23 0133    famotidine (PEPCID) 40 MG tablet  Daily        05/12/23 0134    pantoprazole (PROTONIX) 40 MG tablet  Daily        05/12/23 0134    ondansetron (ZOFRAN-ODT) 4 MG disintegrating tablet  Every 8 hours  PRN        05/12/23 0134              Ernie Avena, MD 05/12/23 0710

## 2023-05-12 NOTE — Discharge Instructions (Addendum)
 Your symptoms could be due to gastritis, gastric reflux or peptic ulcer.  Will start you on Pepcid and Protonix and have you follow-up outpatient with gastroenterology.  We also discharge you on Zofran.  You can also try over-the-counter Maalox.  Return for any severe worsening symptoms, inability to tolerate oral intake or development of severe abdominal pain which would prompt need for CT imaging.

## 2023-06-08 ENCOUNTER — Other Ambulatory Visit: Payer: Self-pay | Admitting: Family Medicine

## 2023-06-08 DIAGNOSIS — F908 Attention-deficit hyperactivity disorder, other type: Secondary | ICD-10-CM

## 2023-06-08 MED ORDER — AMPHETAMINE-DEXTROAMPHET ER 30 MG PO CP24: 30.0000 mg | ORAL_CAPSULE | Freq: Every day | ORAL | 0 refills | Status: DC

## 2023-06-24 ENCOUNTER — Ambulatory Visit: Admitting: Family Medicine

## 2023-06-24 DIAGNOSIS — F908 Attention-deficit hyperactivity disorder, other type: Secondary | ICD-10-CM

## 2023-06-30 ENCOUNTER — Ambulatory Visit: Admitting: Family Medicine

## 2023-06-30 ENCOUNTER — Encounter: Payer: Self-pay | Admitting: Family Medicine

## 2023-06-30 VITALS — BP 130/86 | HR 106 | Temp 98.5°F | Resp 12 | Ht 64.0 in | Wt 173.0 lb

## 2023-06-30 DIAGNOSIS — Z79899 Other long term (current) drug therapy: Secondary | ICD-10-CM | POA: Diagnosis not present

## 2023-06-30 DIAGNOSIS — R41840 Attention and concentration deficit: Secondary | ICD-10-CM

## 2023-06-30 DIAGNOSIS — R1013 Epigastric pain: Secondary | ICD-10-CM

## 2023-06-30 MED ORDER — AMPHETAMINE-DEXTROAMPHET ER 30 MG PO CP24: 30.0000 mg | ORAL_CAPSULE | Freq: Every day | ORAL | 0 refills | Status: DC

## 2023-06-30 MED ORDER — AMPHETAMINE-DEXTROAMPHET ER 30 MG PO CP24
30.0000 mg | ORAL_CAPSULE | Freq: Every day | ORAL | 0 refills | Status: DC
Start: 2023-06-30 — End: 2023-11-27

## 2023-06-30 NOTE — Patient Instructions (Addendum)
 A few things to remember from today's visit:  Other specified attention deficit hyperactivity disorder (ADHD) - Plan: DRUG MONITORING, PANEL 8 WITH CONFIRMATION, URINE  High risk medication use - Plan: DRUG MONITORING, PANEL 8 WITH CONFIRMATION, URINE No change today. Keep appt with gastroenterologist.  If you need refills for medications you take chronically, please call your pharmacy. Do not use My Chart to request refills or for acute issues that need immediate attention. If you send a my chart message, it may take a few days to be addressed, specially if I am not in the office.  Please be sure medication list is accurate. If a new problem present, please set up appointment sooner than planned today.

## 2023-06-30 NOTE — Assessment & Plan Note (Signed)
 Problem is reported as well controlled, tolerating medication well. Continue Adderall XR 30 mg daily, Rx's x 3 sent. We reviewed some side effects. Med contract signed today. PDMP reviewed. Urine tox screening today. F/U in 5-6 months.

## 2023-06-30 NOTE — Progress Notes (Signed)
 HPI: MeganSkyllar MAYCEN Lewis is a 43 y.o. female with a PMHx significant for ADD, GERD, and IBS, who is here today for chronic disease management.  Last seen on 12/17/2022 on video.   ADD: Dx'ed 09/2018. Currently on Adderall XR 30 mg daily.  Medication helps her with concentration and focusing at work, less distracted and being more organized.  She still believes the medication is helping and denies any side effects, including anxiety, depression, insomnia, or tics like biting her fingernails.  She sleeps 6-8 hours per night.   GI problems/heartburn:  She was seen in the ER on 3/11 for severe epigastric abdominal pain, and has since started Famotidine  40 mg daily and Pantoprazole  40 mg daily. She has a colonoscopy scheduled, and according to pt, EGD is being considered if pain is not resolved.  She has had similar pain intermittently for a few years and a few times she has ahd to go to the ED. In the past exacerbated by stress but she denies any stressors at this time. Pain is alleviated some by lying on her kitchen floor, cool tile. Pain has resolved. Negative for fever,chills, changes in bowel habits,or melena. Lab Results  Component Value Date   NA 136 05/11/2023   CL 102 05/11/2023   K 3.7 05/11/2023   CO2 28 05/11/2023   BUN 9 05/11/2023   CREATININE 0.62 05/11/2023   GFRNONAA >60 05/11/2023   CALCIUM 9.7 05/11/2023   ALBUMIN 4.4 05/11/2023   GLUCOSE 97 05/11/2023   Lab Results  Component Value Date   ALT 9 05/11/2023   AST 11 (L) 05/11/2023   ALKPHOS 67 05/11/2023   BILITOT 0.7 05/11/2023   Lab Results  Component Value Date   LIPASE 32 05/11/2023   Lab Results  Component Value Date   WBC 8.0 05/11/2023   HGB 13.9 05/11/2023   HCT 40.0 05/11/2023   MCV 89.7 05/11/2023   PLT 217 05/11/2023   Review of Systems  Constitutional:  Negative for activity change and appetite change.  HENT:  Negative for sore throat and trouble swallowing.   Respiratory:  Negative  for cough, shortness of breath and wheezing.   Gastrointestinal:  Negative for blood in stool, nausea and vomiting.  Endocrine: Negative for cold intolerance and heat intolerance.  Genitourinary:  Negative for decreased urine volume, dysuria and hematuria.  Neurological:  Negative for tremors, syncope, weakness and headaches.  Psychiatric/Behavioral:  Negative for confusion. The patient is not nervous/anxious.   See other pertinent positives and negatives in HPI.  Current Outpatient Medications on File Prior to Visit  Medication Sig Dispense Refill   acetaminophen  (TYLENOL ) 500 MG tablet Take 1,000 mg by mouth every 6 (six) hours as needed for moderate pain.     levonorgestrel  (MIRENA ) 20 MCG/24HR IUD 1 each by Intrauterine route once.     ondansetron  (ZOFRAN -ODT) 4 MG disintegrating tablet Take 1 tablet (4 mg total) by mouth every 8 (eight) hours as needed. 20 tablet 0   valACYclovir (VALTREX) 500 MG tablet 4 TABLETS BY MOUTH AT FIRST SIGHT OF OUTBREAK AND 4 TABS 6 HOURS LATER  2   famotidine  (PEPCID ) 40 MG tablet Take 1 tablet (40 mg total) by mouth daily for 14 days. 14 tablet 0   pantoprazole  (PROTONIX ) 40 MG tablet Take 1 tablet (40 mg total) by mouth daily. 30 tablet 0   No current facility-administered medications on file prior to visit.    Past Medical History:  Diagnosis Date   ADD (attention deficit  disorder)    GERD (gastroesophageal reflux disease)    Allergies  Allergen Reactions   Sulfa Antibiotics Anaphylaxis   Nsaids Other (See Comments)    Severe burning from stomach to back    Social History   Socioeconomic History   Marital status: Married    Spouse name: Not on file   Number of children: Not on file   Years of education: Not on file   Highest education level: Associate degree: occupational, Scientist, product/process development, or vocational program  Occupational History   Not on file  Tobacco Use   Smoking status: Never   Smokeless tobacco: Never  Vaping Use   Vaping status:  Never Used  Substance and Sexual Activity   Alcohol use: Not Currently    Comment: occas   Drug use: No   Sexual activity: Yes    Birth control/protection: I.U.D.  Other Topics Concern   Not on file  Social History Narrative   Not on file   Social Drivers of Health   Financial Resource Strain: Low Risk  (11/13/2021)   Overall Financial Resource Strain (CARDIA)    Difficulty of Paying Living Expenses: Not hard at all  Food Insecurity: No Food Insecurity (11/13/2021)   Hunger Vital Sign    Worried About Running Out of Food in the Last Year: Never true    Ran Out of Food in the Last Year: Never true  Transportation Needs: No Transportation Needs (11/13/2021)   PRAPARE - Administrator, Civil Service (Medical): No    Lack of Transportation (Non-Medical): No  Physical Activity: Insufficiently Active (11/13/2021)   Exercise Vital Sign    Days of Exercise per Week: 3 days    Minutes of Exercise per Session: 30 min  Stress: No Stress Concern Present (11/13/2021)   [employer] of Occupational Health - Occupational Stress Questionnaire    Feeling of Stress : Not at all  Social Connections: Socially Integrated (11/13/2021)   Social Connection and Isolation Panel [NHANES]    Frequency of Communication with Friends and Family: More than three times a week    Frequency of Social Gatherings with Friends and Family: More than three times a week    Attends Religious Services: More than 4 times per year    Active Member of [employer] or Organizations: Yes    Attends Banker Meetings: More than 4 times per year    Marital Status: Married   Vitals:   06/30/23 1006  BP: 130/86  Pulse: (!) 106  Resp: 12  Temp: 98.5 F (36.9 C)  SpO2: 99%   Body mass index is 29.7 kg/m.  Physical Exam Vitals and nursing note reviewed.  Constitutional:      General: She is not in acute distress.    Appearance: She is well-developed.  HENT:     Head: Normocephalic and atraumatic.   Eyes:     Conjunctiva/sclera: Conjunctivae normal.  Cardiovascular:     Rate and Rhythm: Normal rate and regular rhythm.     Heart sounds: No murmur heard.    Comments: HR 100/min Pulmonary:     Effort: Pulmonary effort is normal. No respiratory distress.     Breath sounds: Normal breath sounds.  Abdominal:     Palpations: Abdomen is soft. There is no hepatomegaly or mass.     Tenderness: There is no abdominal tenderness.  Musculoskeletal:     Right lower leg: No edema.     Left lower leg: No edema.  Skin:  General: Skin is warm.     Findings: No erythema or rash.  Neurological:     General: No focal deficit present.     Mental Status: She is alert and oriented to person, place, and time.     Cranial Nerves: No cranial nerve deficit.     Gait: Gait normal.  Psychiatric:        Mood and Affect: Mood and affect normal.    ASSESSMENT AND PLAN:  Ms. Metro was seen today for chronic disease management.   Orders Placed This Encounter  Procedures   DRUG MONITORING, PANEL 8 WITH CONFIRMATION, URINE   Attention or concentration deficit Assessment & Plan: Problem is reported as well controlled, tolerating medication well. Continue Adderall XR 30 mg daily, Rx's x 3 sent. We reviewed some side effects. Med contract signed today. PDMP reviewed. Urine tox screening today. F/U in 5-6 months.  Orders: -     DRUG MONITORING, PANEL 8 WITH CONFIRMATION, URINE; Future -     Amphetamine -Dextroamphet ER; Take 1 capsule (30 mg total) by mouth daily.  Dispense: 30 capsule; Refill: 0 -     Amphetamine -Dextroamphet ER; Take 1 capsule (30 mg total) by mouth daily.  Dispense: 30 capsule; Refill: 0 -     Amphetamine -Dextroamphet ER; Take 1 capsule (30 mg total) by mouth daily.  Dispense: 30 capsule; Refill: 0  High risk medication use -     DRUG MONITORING, PANEL 8 WITH CONFIRMATION, URINE; Future  Epigastric abdominal pain We discussed possible etiologies, problem has been  recurrent for a few years. ?  Dyspepsia. Continue pantoprazole  40 mg, recommend taking it 30 minutes before breakfast. She is also on famotidine  40 mg daily at bedtime. GERD precautions. She has an appointment scheduled with GI to discuss colonoscopy, she may also need EGD.  Return in about 25 weeks (around 12/22/2023) for chronic problems.  I, Fritz Jewel Wierda, acting as a scribe for Jameil Whitmoyer [place], MD., have documented all relevant documentation on the behalf of Tyreik Delahoussaye [place], MD, as directed by  Heba Ige [place], MD while in the presence of Raysean Graumann [place], MD.   I, Savino Whisenant [place], MD, have reviewed all documentation for this visit. The documentation on 06/30/23 for the exam, diagnosis, procedures, and orders are all accurate and complete.  [place]  [id number] [facility]. [place] office.

## 2023-07-02 LAB — DM TEMPLATE

## 2023-07-02 LAB — DRUG MONITORING, PANEL 8 WITH CONFIRMATION, URINE
6 Acetylmorphine: NEGATIVE ng/mL (ref <10)
Alcohol Metabolites: NEGATIVE ng/mL (ref <500)
Amphetamine: 4131 ng/mL — ABNORMAL HIGH (ref <250)
Amphetamines: POSITIVE ng/mL — AB (ref <500)
Benzodiazepines: NEGATIVE ng/mL (ref <100)
Buprenorphine, Urine: NEGATIVE ng/mL (ref <5)
Cocaine Metabolite: NEGATIVE ng/mL (ref <150)
Creatinine: 201.8 mg/dL (ref >=20.0)
MDMA: NEGATIVE ng/mL (ref <500)
Marijuana Metabolite: NEGATIVE ng/mL (ref <20)
Methamphetamine: NEGATIVE ng/mL (ref <250)
Opiates: NEGATIVE ng/mL (ref <100)
Oxidant: NEGATIVE ug/mL (ref <200)
Oxycodone: NEGATIVE ng/mL (ref <100)
pH: 7.3 (ref 4.5–9.0)

## 2023-07-22 LAB — HM COLONOSCOPY

## 2023-09-03 ENCOUNTER — Other Ambulatory Visit: Payer: Self-pay | Admitting: Orthopaedic Surgery

## 2023-09-03 DIAGNOSIS — M25572 Pain in left ankle and joints of left foot: Secondary | ICD-10-CM

## 2023-10-20 ENCOUNTER — Other Ambulatory Visit: Payer: Self-pay | Admitting: Family Medicine

## 2023-10-20 DIAGNOSIS — R41840 Attention and concentration deficit: Secondary | ICD-10-CM

## 2023-10-20 MED ORDER — AMPHETAMINE-DEXTROAMPHET ER 30 MG PO CP24: 30.0000 mg | ORAL_CAPSULE | Freq: Every day | ORAL | 0 refills | Status: DC

## 2023-10-20 NOTE — Telephone Encounter (Signed)
 Last OV: 06/30/23 Med Contract: 06/30/23 Urine Tox: 06/30/23

## 2023-10-20 NOTE — Telephone Encounter (Signed)
 Copied from CRM [id number]. Topic: Clinical - Medication Refill >> Oct 20, 2023 12:18 PM Pinkey ORN wrote: Medication: amphetamine -dextroamphetamine (ADDERALL XR) 30 MG 24 hr capsule  Has the patient contacted their pharmacy? Yes (Agent: If no, request that the patient contact the pharmacy for the refill. If patient does not wish to contact the pharmacy document the reason why and proceed with request.) (Agent: If yes, when and what did the pharmacy advise?)  This is the patient's preferred pharmacy:  [id number] [address] [place], [place] - [address] [place] [address] Phone: [phone] Fax: [phone]   Is this the correct pharmacy for this prescription? Yes If no, delete pharmacy and type the correct one.   Has the prescription been filled recently? No  Is the patient out of the medication? No  Has the patient been seen for an appointment in the last year OR does the patient have an upcoming appointment? Yes  Can we respond through MyChart? Yes  Agent: Please be advised that Rx refills may take up to 3 business days. We ask that you follow-up with your pharmacy.

## 2023-11-27 ENCOUNTER — Other Ambulatory Visit: Payer: Self-pay | Admitting: Family Medicine

## 2023-11-27 DIAGNOSIS — R41840 Attention and concentration deficit: Secondary | ICD-10-CM

## 2023-11-27 NOTE — Telephone Encounter (Signed)
 Copied from CRM [id number]. Topic: Clinical - Medication Refill >> Nov 27, 2023  9:04 AM Alfonso ORN wrote: Medication: amphetamine -dextroamphetamine (ADDERALL XR) 30 MG 24 hr capsule  Has the patient contacted their pharmacy? Yes (Agent: If no, request that the patient contact the pharmacy for the refill. If patient does not wish to contact the pharmacy document the reason why and proceed with request.) (Agent: If yes, when and what did the pharmacy advise?)  This is the patient's preferred pharmacy:  [id number] [address], [place] - [address] [place] [address] Phone: [phone] Fax: [phone]   Is this the correct pharmacy for this prescription? Yes    Has the prescription been filled recently? No  Is the patient out of the medication? Yes  Has the patient been seen for an appointment in the last year OR does the patient have an upcoming appointment? Yes  Can we respond through MyChart? Yes

## 2023-11-30 MED ORDER — AMPHETAMINE-DEXTROAMPHET ER 30 MG PO CP24: 30.0000 mg | ORAL_CAPSULE | Freq: Every day | ORAL | 0 refills | Status: DC

## 2024-01-08 ENCOUNTER — Other Ambulatory Visit: Payer: Self-pay | Admitting: Family Medicine

## 2024-01-08 DIAGNOSIS — R41840 Attention and concentration deficit: Secondary | ICD-10-CM

## 2024-01-08 NOTE — Telephone Encounter (Signed)
 Copied from CRM #[id number]. Topic: Clinical - Medication Refill >> Jan 08, 2024 10:09 AM Berneda FALCON wrote: Medication: amphetamine -dextroamphetamine (ADDERALL XR) 30 MG 24 hr capsule  Patient has an appt on 11/10 and is completely out of medication, would like enough to last her until her upcoming appt.  Has the patient contacted their pharmacy? No, controlled substance (Agent: If no, request that the patient contact the pharmacy for the refill. If patient does not wish to contact the pharmacy document the reason why and proceed with request.) (Agent: If yes, when and what did the pharmacy advise?)  This is the patient's preferred pharmacy:  [id number] [address], [place] - [address] [place] [address] Phone: [phone] Fax: [phone]  Is this the correct pharmacy for this prescription? Yes If no, delete pharmacy and type the correct one.   Has the prescription been filled recently? No  Is the patient out of the medication? Yes  Has the patient been seen for an appointment in the last year OR does the patient have an upcoming appointment? No  Can we respond through MyChart? Yes  Agent: Please be advised that Rx refills may take up to 3 business days. We ask that you follow-up with your pharmacy.

## 2024-01-08 NOTE — Telephone Encounter (Unsigned)
 Copied from CRM #[id number]. Topic: Clinical - Medication Refill >> Jan 08, 2024 10:09 AM Berneda FALCON wrote: Medication: amphetamine -dextroamphetamine (ADDERALL XR) 30 MG 24 hr capsule  Patient has an appt on 11/10 and is completely out of medication, would like enough to last her until her upcoming appt.  Has the patient contacted their pharmacy? No, controlled substance (Agent: If no, request that the patient contact the pharmacy for the refill. If patient does not wish to contact the pharmacy document the reason why and proceed with request.) (Agent: If yes, when and what did the pharmacy advise?)  This is the patient's preferred pharmacy:  [id number] [address], [place] - [address] [place] [address] Phone: [phone] Fax: [phone]  Is this the correct pharmacy for this prescription? Yes If no, delete pharmacy and type the correct one.   Has the prescription been filled recently? No  Is the patient out of the medication? Yes  Has the patient been seen for an appointment in the last year OR does the patient have an upcoming appointment? No  Can we respond through MyChart? Yes  Agent: Please be advised that Rx refills may take up to 3 business days. We ask that you follow-up with your pharmacy.

## 2024-01-11 ENCOUNTER — Encounter: Payer: Self-pay | Admitting: Family Medicine

## 2024-01-11 ENCOUNTER — Telehealth: Admitting: Family Medicine

## 2024-01-11 DIAGNOSIS — R41840 Attention and concentration deficit: Secondary | ICD-10-CM

## 2024-01-11 MED ORDER — AMPHETAMINE-DEXTROAMPHET ER 30 MG PO CP24: 30.0000 mg | ORAL_CAPSULE | Freq: Every day | ORAL | 0 refills | Status: AC

## 2024-01-11 NOTE — Assessment & Plan Note (Signed)
 Problem is well-controlled with current treatment. Continue Adderall XR 30 mg daily, Rx's x 3 sent. We reviewed some side effects. Med contract signed in 06/2023. PDMP reviewed. F/U in 5-6 months, before if needed.

## 2024-01-11 NOTE — Progress Notes (Signed)
 Virtual Visit via Video Note I connected with Megan Lewis on 01/11/24 by a video enabled telemedicine application and verified that I am speaking with the correct person using two identifiers. Location patient: home Location provider:work office Persons participating in the virtual visit: patient, provider  I discussed the limitations of evaluation and management by telemedicine and the availability of in person appointments. The patient expressed understanding and agreed to proceed.  Chief Complaint  Patient presents with   Medical Management of Chronic Issues   HPI: [name] is a 43 y.o. female with a PMHx significant for ADD, GERD, and IBS, who is here today for chronic disease managemen  She was last seen on 06/30/2023. Since her last visit she has follow-up with gastroenterology due to epigastric pain and GERD, she underwent colonoscopy. Symptoms have resolved, currently she is on pantoprazole  40 mg daily and she has been avoiding trigger factors.  ADD diagnosed in 09/2018. She is currently on Adderall XR 30 mg daily. Medication is still helping with concentration and able to focus at work, getting less distracted and more organized. She is tolerating medication well. She denies any symptom suggestive of anxiety or depression. She is sleeping about 6 to 8 hours per night. Negative for headache, visual changes, CP, palpitation, abdominal pain, nausea, tremor, or changes in appetite.  ROS: See pertinent positives and negatives per HPI.  Past Medical History:  Diagnosis Date   ADD (attention deficit disorder)    GERD (gastroesophageal reflux disease)     Past Surgical History:  Procedure Laterality Date   CYST EXCISION N/A 02/16/2019   Procedure: EXCISION OF SEBACEOUS CYST -SCALP;  Surgeon: Belinda Cough, MD;  Location: [facility];  Service: General;  Laterality: N/A;   Family History  Problem Relation Age of Onset   Hyperlipidemia Mother     Heart disease Mother    Hypertension Mother    Hyperlipidemia Father    Heart disease Father    Hypertension Father    Cancer Maternal Grandmother        breast   Cancer Paternal Grandfather        lung    Social History   Socioeconomic History   Marital status: Married    Spouse name: Not on file   Number of children: Not on file   Years of education: Not on file   Highest education level: Associate degree: occupational, scientist, product/process development, or vocational program  Occupational History   Not on file  Tobacco Use   Smoking status: Never   Smokeless tobacco: Never  Vaping Use   Vaping status: Never Used  Substance and Sexual Activity   Alcohol use: Not Currently    Comment: occas   Drug use: No   Sexual activity: Yes    Birth control/protection: I.U.D.  Other Topics Concern   Not on file  Social History Narrative   Not on file   Social Drivers of Health   Financial Resource Strain: Low Risk  (11/13/2021)   Overall Financial Resource Strain (CARDIA)    Difficulty of Paying Living Expenses: Not hard at all  Food Insecurity: No Food Insecurity (11/13/2021)   Hunger Vital Sign    Worried About Running Out of Food in the Last Year: Never true    Ran Out of Food in the Last Year: Never true  Transportation Needs: No Transportation Needs (11/13/2021)   PRAPARE - Administrator, Civil Service (Medical): No    Lack of Transportation (Non-Medical): No  Physical Activity: Insufficiently Active (11/13/2021)   Exercise Vital Sign    Days of Exercise per Week: 3 days    Minutes of Exercise per Session: 30 min  Stress: No Stress Concern Present (11/13/2021)   [employer] of Occupational Health - Occupational Stress Questionnaire    Feeling of Stress : Not at all  Social Connections: Socially Integrated (11/13/2021)   Social Connection and Isolation Panel    Frequency of Communication with Friends and Family: More than three times a week    Frequency of Social Gatherings  with Friends and Family: More than three times a week    Attends Religious Services: More than 4 times per year    Active Member of [employer] or Organizations: Yes    Attends Engineer, Structural: More than 4 times per year    Marital Status: Married  Catering Manager Violence: Not on file     Current Outpatient Medications:    acetaminophen  (TYLENOL ) 500 MG tablet, Take 1,000 mg by mouth every 6 (six) hours as needed for moderate pain., Disp: , Rfl:    famotidine  (PEPCID ) 40 MG tablet, Take 1 tablet (40 mg total) by mouth daily for 14 days., Disp: 14 tablet, Rfl: 0   levonorgestrel  (MIRENA ) 20 MCG/24HR IUD, 1 each by Intrauterine route once., Disp: , Rfl:    ondansetron  (ZOFRAN -ODT) 4 MG disintegrating tablet, Take 1 tablet (4 mg total) by mouth every 8 (eight) hours as needed., Disp: 20 tablet, Rfl: 0   pantoprazole  (PROTONIX ) 40 MG tablet, Take 1 tablet (40 mg total) by mouth daily., Disp: 30 tablet, Rfl: 0   valACYclovir (VALTREX) 500 MG tablet, 4 TABLETS BY MOUTH AT FIRST SIGHT OF OUTBREAK AND 4 TABS 6 HOURS LATER, Disp: , Rfl: 2   amphetamine -dextroamphetamine (ADDERALL XR) 30 MG 24 hr capsule, Take 1 capsule (30 mg total) by mouth daily., Disp: 30 capsule, Rfl: 0   amphetamine -dextroamphetamine (ADDERALL XR) 30 MG 24 hr capsule, Take 1 capsule (30 mg total) by mouth daily., Disp: 30 capsule, Rfl: 0   amphetamine -dextroamphetamine (ADDERALL XR) 30 MG 24 hr capsule, Take 1 capsule (30 mg total) by mouth daily., Disp: 30 capsule, Rfl: 0  EXAM:  VITALS per patient if applicable:Temp 97.9 F (36.6 C)   Ht 5' 4 (1.626 m)   Wt 173 lb (78.5 kg)   BMI 29.70 kg/m   GENERAL: alert, oriented, appears well and in no acute distress  HEENT: atraumatic, conjunctiva clear, no obvious abnormalities on inspection of external nose and ears  NECK: normal movements of the head and neck  LUNGS: on inspection no signs of respiratory distress, breathing rate appears normal, no obvious gross  SOB, gasping or wheezing  CV: no obvious cyanosis  PSYCH/NEURO: pleasant and cooperative, no obvious depression or anxiety, speech and thought processing grossly intact  ASSESSMENT AND PLAN:  Discussed the following assessment and plan:  Attention or concentration deficit Assessment & Plan: Problem is well-controlled with current treatment. Continue Adderall XR 30 mg daily, Rx's x 3 sent. We reviewed some side effects. Med contract signed in 06/2023. PDMP reviewed. F/U in 5-6 months, before if needed.  Orders: -     Amphetamine -Dextroamphet ER; Take 1 capsule (30 mg total) by mouth daily.  Dispense: 30 capsule; Refill: 0 -     Amphetamine -Dextroamphet ER; Take 1 capsule (30 mg total) by mouth daily.  Dispense: 30 capsule; Refill: 0 -     Amphetamine -Dextroamphet ER; Take 1 capsule (30 mg total) by mouth daily.  Dispense: 30 capsule; Refill: 0   We discussed possible serious and likely etiologies, options for evaluation and workup, limitations of telemedicine visit vs in person visit, treatment, treatment risks and precautions. The patient was advised to call back or seek an in-person evaluation if the symptoms worsen or if the condition fails to improve as anticipated. I discussed the assessment and treatment plan with the patient. The patient was provided an opportunity to ask questions and all were answered. The patient agreed with the plan and demonstrated an understanding of the instructions.  Return in about 6 months (around 07/08/2024) for chronic problems.  Xianna Siverling, MD
